# Patient Record
Sex: Female | Born: 1935 | Race: White | Hispanic: No | Smoking: Never smoker
Health system: Southern US, Community
[De-identification: ages and names within clinical notes are randomized; demographics above are authoritative.]

## PROBLEM LIST (undated history)

## (undated) DIAGNOSIS — N200 Calculus of kidney: Secondary | ICD-10-CM

## (undated) DIAGNOSIS — N390 Urinary tract infection, site not specified: Secondary | ICD-10-CM

## (undated) DIAGNOSIS — C801 Malignant (primary) neoplasm, unspecified: Secondary | ICD-10-CM

## (undated) DIAGNOSIS — I639 Cerebral infarction, unspecified: Secondary | ICD-10-CM

## (undated) HISTORY — DX: Cerebral infarction, unspecified: I63.9

## (undated) HISTORY — PX: COLON SURGERY: SHX602

## (undated) HISTORY — PX: ABDOMINAL HYSTERECTOMY: SHX81

---

## 2015-10-12 ENCOUNTER — Encounter (HOSPITAL_BASED_OUTPATIENT_CLINIC_OR_DEPARTMENT_OTHER): Payer: Self-pay | Admitting: *Deleted

## 2015-10-12 ENCOUNTER — Emergency Department (HOSPITAL_BASED_OUTPATIENT_CLINIC_OR_DEPARTMENT_OTHER): Payer: Medicare Other

## 2015-10-12 ENCOUNTER — Inpatient Hospital Stay (HOSPITAL_BASED_OUTPATIENT_CLINIC_OR_DEPARTMENT_OTHER)
Admission: EM | Admit: 2015-10-12 | Discharge: 2015-10-18 | DRG: 065 | Disposition: A | Discharge status: Skilled Nursing Facility | Payer: Medicare Other | Attending: Internal Medicine | Admitting: Internal Medicine

## 2015-10-12 DIAGNOSIS — E669 Obesity, unspecified: Secondary | ICD-10-CM | POA: Diagnosis present

## 2015-10-12 DIAGNOSIS — R410 Disorientation, unspecified: Secondary | ICD-10-CM | POA: Diagnosis present

## 2015-10-12 DIAGNOSIS — Z91013 Allergy to seafood: Secondary | ICD-10-CM

## 2015-10-12 DIAGNOSIS — R778 Other specified abnormalities of plasma proteins: Secondary | ICD-10-CM | POA: Diagnosis present

## 2015-10-12 DIAGNOSIS — F0150 Vascular dementia without behavioral disturbance: Secondary | ICD-10-CM | POA: Diagnosis present

## 2015-10-12 DIAGNOSIS — Z6834 Body mass index (BMI) 34.0-34.9, adult: Secondary | ICD-10-CM

## 2015-10-12 DIAGNOSIS — I639 Cerebral infarction, unspecified: Secondary | ICD-10-CM | POA: Diagnosis present

## 2015-10-12 DIAGNOSIS — E785 Hyperlipidemia, unspecified: Secondary | ICD-10-CM | POA: Diagnosis present

## 2015-10-12 DIAGNOSIS — Z87442 Personal history of urinary calculi: Secondary | ICD-10-CM

## 2015-10-12 DIAGNOSIS — Z8673 Personal history of transient ischemic attack (TIA), and cerebral infarction without residual deficits: Secondary | ICD-10-CM

## 2015-10-12 DIAGNOSIS — I10 Essential (primary) hypertension: Secondary | ICD-10-CM | POA: Diagnosis present

## 2015-10-12 DIAGNOSIS — Z23 Encounter for immunization: Secondary | ICD-10-CM

## 2015-10-12 DIAGNOSIS — I63412 Cerebral infarction due to embolism of left middle cerebral artery: Secondary | ICD-10-CM | POA: Diagnosis not present

## 2015-10-12 DIAGNOSIS — E7800 Pure hypercholesterolemia, unspecified: Secondary | ICD-10-CM

## 2015-10-12 DIAGNOSIS — R4182 Altered mental status, unspecified: Secondary | ICD-10-CM

## 2015-10-12 DIAGNOSIS — B961 Klebsiella pneumoniae [K. pneumoniae] as the cause of diseases classified elsewhere: Secondary | ICD-10-CM | POA: Diagnosis present

## 2015-10-12 DIAGNOSIS — Z87440 Personal history of urinary (tract) infections: Secondary | ICD-10-CM

## 2015-10-12 DIAGNOSIS — R4701 Aphasia: Secondary | ICD-10-CM | POA: Diagnosis present

## 2015-10-12 DIAGNOSIS — Z85038 Personal history of other malignant neoplasm of large intestine: Secondary | ICD-10-CM | POA: Diagnosis present

## 2015-10-12 DIAGNOSIS — R030 Elevated blood-pressure reading, without diagnosis of hypertension: Secondary | ICD-10-CM | POA: Diagnosis present

## 2015-10-12 DIAGNOSIS — N390 Urinary tract infection, site not specified: Secondary | ICD-10-CM

## 2015-10-12 DIAGNOSIS — Z9049 Acquired absence of other specified parts of digestive tract: Secondary | ICD-10-CM

## 2015-10-12 HISTORY — DX: Calculus of kidney: N20.0

## 2015-10-12 HISTORY — DX: Urinary tract infection, site not specified: N39.0

## 2015-10-12 HISTORY — DX: Malignant (primary) neoplasm, unspecified: C80.1

## 2015-10-12 LAB — COMPREHENSIVE METABOLIC PANEL
ALK PHOS: 64 U/L (ref 38–126)
ALT: 9 U/L — AB (ref 14–54)
ANION GAP: 8 (ref 5–15)
AST: 14 U/L — ABNORMAL LOW (ref 15–41)
Albumin: 4.1 g/dL (ref 3.5–5.0)
BILIRUBIN TOTAL: 0.6 mg/dL (ref 0.3–1.2)
BUN: 22 mg/dL — ABNORMAL HIGH (ref 6–20)
CALCIUM: 9.6 mg/dL (ref 8.9–10.3)
CO2: 21 mmol/L — AB (ref 22–32)
CREATININE: 1.23 mg/dL — AB (ref 0.44–1.00)
Chloride: 109 mmol/L (ref 101–111)
GFR, EST AFRICAN AMERICAN: 45 mL/min — AB (ref >60)
GFR, EST NON AFRICAN AMERICAN: 39 mL/min — AB (ref >60)
Glucose, Bld: 121 mg/dL — ABNORMAL HIGH (ref 65–99)
Potassium: 3.9 mmol/L (ref 3.5–5.1)
Sodium: 138 mmol/L (ref 135–145)
TOTAL PROTEIN: 7.1 g/dL (ref 6.5–8.1)

## 2015-10-12 LAB — CBC WITH DIFFERENTIAL/PLATELET
Basophils Absolute: 0 10*3/uL (ref 0.0–0.1)
Basophils Relative: 0 %
EOS ABS: 0 10*3/uL (ref 0.0–0.7)
Eosinophils Relative: 0 %
HEMATOCRIT: 44.8 % (ref 36.0–46.0)
HEMOGLOBIN: 14.7 g/dL (ref 12.0–15.0)
LYMPHS ABS: 2 10*3/uL (ref 0.7–4.0)
LYMPHS PCT: 24 %
MCH: 27.7 pg (ref 26.0–34.0)
MCHC: 32.8 g/dL (ref 30.0–36.0)
MCV: 84.4 fL (ref 78.0–100.0)
MONOS PCT: 8 %
Monocytes Absolute: 0.7 10*3/uL (ref 0.1–1.0)
NEUTROS ABS: 5.7 10*3/uL (ref 1.7–7.7)
NEUTROS PCT: 68 %
Platelets: 215 10*3/uL (ref 150–400)
RBC: 5.31 MIL/uL — AB (ref 3.87–5.11)
RDW: 14.7 % (ref 11.5–15.5)
WBC: 8.5 10*3/uL (ref 4.0–10.5)

## 2015-10-12 LAB — URINE MICROSCOPIC-ADD ON: SQUAMOUS EPITHELIAL / LPF: NONE SEEN

## 2015-10-12 LAB — URINALYSIS, ROUTINE W REFLEX MICROSCOPIC
BILIRUBIN URINE: NEGATIVE
Glucose, UA: NEGATIVE mg/dL
HGB URINE DIPSTICK: NEGATIVE
KETONES UR: NEGATIVE mg/dL
NITRITE: POSITIVE — AB
PH: 6 (ref 5.0–8.0)
Protein, ur: NEGATIVE mg/dL
SPECIFIC GRAVITY, URINE: 1.014 (ref 1.005–1.030)

## 2015-10-12 LAB — TROPONIN I: Troponin I: 0.03 ng/mL (ref <0.03)

## 2015-10-12 LAB — CBG MONITORING, ED: Glucose-Capillary: 119 mg/dL — ABNORMAL HIGH (ref 65–99)

## 2015-10-12 MED ORDER — ASPIRIN EC 81 MG PO TBEC
81.0000 mg | DELAYED_RELEASE_TABLET | Freq: Once | ORAL | Status: AC
  Administered 2015-10-12: 81 mg via ORAL
  Filled 2015-10-12: qty 1

## 2015-10-12 NOTE — Progress Notes (Addendum)
Patient has arrived to unit via Insurance underwriter from [employer]. Patient alert to self only, pleasantly confused. Patient has no complaints at this time and does not appear to be in any distress. Patient placed on cardiac monitor. Call bell in reach. Will monitor

## 2015-10-12 NOTE — ED Notes (Signed)
md and primary rn notified of troponin.

## 2015-10-12 NOTE — ED Triage Notes (Signed)
Confusion. Daughter states she had an episode of mumbling and altered mental status last night but the symptoms resolved. She went to her MD this am and was alert but became confused at 11am while in the office.

## 2015-10-12 NOTE — Progress Notes (Signed)
Patient placement made aware of patient's arrival and will send information out to attending MD.

## 2015-10-12 NOTE — Progress Notes (Signed)
Spoke with MD who will come see patient.

## 2015-10-12 NOTE — ED Provider Notes (Signed)
[facility] Provider Note   CSN: [id number] Arrival date & time: 10/12/15  1649  By signing my name below, I, Arianna Nassar, attest that this documentation has been prepared under the direction and in the presence of Jola Schmidt, MD.  Electronically Signed: Julien Nordmann, ED Scribe. 10/12/15. 5:36 PM.    History   Chief Complaint Chief Complaint  Patient presents with  . Altered Mental Status   LEVEL 5 CAVEAT DUE TO ALTERED MENTAL STATUS   The history is provided by a relative. The history is limited by the condition of the patient. No language interpreter was used.   HPI Comments: Darlene Flores is a 82 y.o. female who has a PMhx of cancer, nephrolithiasis, and UTI presents to the Emergency Department complaining of sudden onset, intermittent, gradual worsening, altered mental status x 6 hours. Pt had an episode of confusion last night around 8 pm that lasted for ~ 15 minutes. Daughter notes she asked the pt as question and she states that pt began to [facility] words. Daughter notes pt went to sleep and woke up this morning with no confusion. They went to pt's colon cancer MD this morning ~ 11 am this morning and was unable to recall her birth date. Pt has hx of frequent urosepsis from UTI and bilateral nephrolithiasis. Pt also has a hx of nephrostomy tubes placed. Daughter notes pt constantly has a low-grade UTI. Pt does not have a hx of CVA, hypercholesterolemia, MI, DM or stents placed. She currently denies any pain. Daughter denies any new cold symptoms, fever, loss of appetite, abdominal pain, or recent injuries/falls.    Past Medical History:  Diagnosis Date  . Cancer ([place])   . Kidney stones   . UTI (urinary tract infection)     There are no active problems to display for this patient.   Past Surgical History:  Procedure Laterality Date  . ABDOMINAL HYSTERECTOMY    . COLON SURGERY      OB History    No data available       Home Medications    Prior  to Admission medications   Medication Sig Start Date End Date Taking? Authorizing Provider  psyllium (METAMUCIL) 58.6 % packet Take 1 packet by mouth daily.   Yes Historical Provider, MD    Family History No family history on file.  Social History Social History  Substance Use Topics  . Smoking status: Never Smoker  . Smokeless tobacco: Never Used  . Alcohol use No     Allergies   Iodine and Shellfish allergy   Review of Systems Review of Systems  LEVEL 5 CAVEAT DUE TO ALTERED MENTAL STATUS  Physical Exam Updated Vital Signs BP 154/87   Pulse 95   Temp 98 F (36.7 C) (Oral)   Resp 18   SpO2 95%   Physical Exam  Constitutional: She appears well-developed and well-nourished. No distress.  HENT:  Head: Normocephalic and atraumatic.  Eyes: EOM are normal. Pupils are equal, round, and reactive to light.  Neck: Normal range of motion.  Cardiovascular: Normal rate, regular rhythm and normal heart sounds.   Pulmonary/Chest: Effort normal and breath sounds normal.  Abdominal: Soft. She exhibits no distension. There is no tenderness.  Musculoskeletal: Normal range of motion.  Neurological: She is alert.  5/5 strength in major muscle groups of  bilateral upper and lower extremities. Speech normal. No facial asymetry.  Difficulty naming simple objects. Oriented to self and month but not year.  Skin: Skin is  warm and dry.  Psychiatric: She has a normal mood and affect. Judgment normal.  Nursing note and vitals reviewed.    ED Treatments / Results  DIAGNOSTIC STUDIES: Oxygen Saturation is 95% on RA, adequate by my interpretation.  COORDINATION OF CARE:  5:31 PM Will order CT Head wo contrast. Discussed treatment plan with pt at bedside and pt agreed to plan.  Labs (all labs ordered are listed, but only abnormal results are displayed) Labs Reviewed  CBC WITH DIFFERENTIAL/PLATELET - Abnormal; Notable for the following:       Result Value   RBC 5.31 (*)    All other  components within normal limits  COMPREHENSIVE METABOLIC PANEL - Abnormal; Notable for the following:    CO2 21 (*)    Glucose, Bld 121 (*)    BUN 22 (*)    Creatinine, Ser 1.23 (*)    AST 14 (*)    ALT 9 (*)    GFR calc non Af Amer 39 (*)    GFR calc Af Amer 45 (*)    All other components within normal limits  TROPONIN I - Abnormal; Notable for the following:    Troponin I 0.03 (*)    All other components within normal limits  URINALYSIS, ROUTINE W REFLEX MICROSCOPIC (NOT AT [facility]) - Abnormal; Notable for the following:    APPearance CLOUDY (*)    Nitrite POSITIVE (*)    Leukocytes, UA LARGE (*)    All other components within normal limits  URINE MICROSCOPIC-ADD ON - Abnormal; Notable for the following:    Bacteria, UA MANY (*)    All other components within normal limits  CBG MONITORING, ED - Abnormal; Notable for the following:    Glucose-Capillary 119 (*)    All other components within normal limits  URINE CULTURE    EKG  EKG Interpretation  Date/Time:  Thursday October 12 2015 17:08:40 EDT Ventricular Rate:  90 PR Interval:    QRS Duration: 98 QT Interval:  353 QTC Calculation: 432 R Axis:   -23 Text Interpretation:  Sinus rhythm Left ventricular hypertrophy Inferior infarct, old No old tracing to compare Confirmed by [place] Decoteau  MD, Lennette Bihari ([id number]) on 10/12/2015 5:30:33 PM       Radiology Dg Chest 2 View  Result Date: 10/12/2015 CLINICAL DATA:  Altered mental status with confusion for the past 6 hours. EXAM: CHEST  2 VIEW COMPARISON:  None. FINDINGS: Enlarged cardiac silhouette with a prominent left ventricular contour. Tortuous and calcified thoracic aorta. Clear lungs with mildly prominent interstitial markings. No pleural fluid. Thoracic spine degenerative changes. Bilateral shoulder degenerative changes with evidence of chronic bilateral rotator cuff tears. Diffuse osteopenia. IMPRESSION: 1. No acute abnormality. 2. Cardiomegaly with probable left ventricular  hypertrophy. 3. Aortic atherosclerosis. Electronically Signed   By: Claudie Revering M.D.   On: 10/12/2015 18:07   Ct Head Wo Contrast  Result Date: 10/12/2015 CLINICAL DATA:  Altered mental status for the past 6 hours, with confusion. EXAM: CT HEAD WITHOUT CONTRAST TECHNIQUE: Contiguous axial images were obtained from the base of the skull through the vertex without intravenous contrast. COMPARISON:  None. FINDINGS: Brain: Diffusely enlarged ventricles and subarachnoid spaces. Patchy white matter low density in both cerebral hemispheres. Old right frontal lobe infarct additional old right frontal lobe lacunar infarct. Left occipital arachnoid cyst at the small communication with the occipital horn of the left lateral ventricle. No intracranial hemorrhage, mass lesion or CT evidence of acute infarction. Vascular: No hyperdense vessel or unexpected calcification.  Skull: Normal. Negative for fracture or focal lesion. Sinuses/Orbits: Right mid ethmoid sinus mucosal thickening. Unremarkable orbits. Other: None. IMPRESSION: 1. No acute abnormality. 2. Mild to moderate diffuse cerebral atrophy and mild cerebellar atrophy. 3. 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 4. Mild chronic small vessel white matter ischemic changes in both cerebral hemispheres. Electronically Signed   By: Claudie Revering M.D.   On: 10/12/2015 18:06    Procedures Procedures (including critical care time)  Medications Ordered in ED Medications  aspirin EC tablet 81 mg (not administered)     Initial Impression / Assessment and Plan / ED Course  I have reviewed the triage vital signs and the nursing notes.  Pertinent labs & imaging results that were available during my care of the patient were reviewed by me and considered in my medical decision making (see chart for details).  Clinical Course   Patient with acute confusion. Nothing focal on exam except for what appears to be a receptive aphasia. Patient has difficulty naming simple objects.  She has difficulty following the conversation and comprehending the care that is being provided. This is very abnormal for the patient. She also has a history of recurrent nitrite positive urine which currently are infectious disease and urology team awake for*not treating unless she has urologic symptoms. Currently she is without suprapubic pain, fever, elevated white blood cell count. I therefore sent her urine for culture and we'll elect not to treat based on family preference at this time. She has no significant medical problems but her CT head does show evidence of prior stroke. I suspect this is recurrent stroke acutely. Nothing by mouth until swallow screen. Aspirin once her swallow screen is passed. Patient will be placed in observational status at [facility]  I personally performed the services described in this documentation, which was scribed in my presence. The recorded information has been reviewed and is accurate.     Final Clinical Impressions(s) / ED Diagnoses   Final diagnoses:  Altered mental status, unspecified altered mental status type    New Prescriptions New Prescriptions   No medications on file     Jola Schmidt, MD 10/12/15 [phone]

## 2015-10-13 ENCOUNTER — Encounter (HOSPITAL_COMMUNITY): Payer: Self-pay | Admitting: Internal Medicine

## 2015-10-13 ENCOUNTER — Observation Stay (HOSPITAL_COMMUNITY): Payer: Medicare Other

## 2015-10-13 DIAGNOSIS — Z91013 Allergy to seafood: Secondary | ICD-10-CM | POA: Diagnosis not present

## 2015-10-13 DIAGNOSIS — B961 Klebsiella pneumoniae [K. pneumoniae] as the cause of diseases classified elsewhere: Secondary | ICD-10-CM | POA: Diagnosis present

## 2015-10-13 DIAGNOSIS — I6789 Other cerebrovascular disease: Secondary | ICD-10-CM | POA: Diagnosis not present

## 2015-10-13 DIAGNOSIS — R4182 Altered mental status, unspecified: Secondary | ICD-10-CM | POA: Diagnosis present

## 2015-10-13 DIAGNOSIS — I639 Cerebral infarction, unspecified: Secondary | ICD-10-CM | POA: Diagnosis not present

## 2015-10-13 DIAGNOSIS — Z85038 Personal history of other malignant neoplasm of large intestine: Secondary | ICD-10-CM | POA: Diagnosis present

## 2015-10-13 DIAGNOSIS — R778 Other specified abnormalities of plasma proteins: Secondary | ICD-10-CM | POA: Diagnosis present

## 2015-10-13 DIAGNOSIS — Z23 Encounter for immunization: Secondary | ICD-10-CM | POA: Diagnosis not present

## 2015-10-13 DIAGNOSIS — N390 Urinary tract infection, site not specified: Secondary | ICD-10-CM | POA: Diagnosis present

## 2015-10-13 DIAGNOSIS — I63412 Cerebral infarction due to embolism of left middle cerebral artery: Principal | ICD-10-CM

## 2015-10-13 DIAGNOSIS — Z87442 Personal history of urinary calculi: Secondary | ICD-10-CM | POA: Diagnosis not present

## 2015-10-13 DIAGNOSIS — R4701 Aphasia: Secondary | ICD-10-CM | POA: Diagnosis present

## 2015-10-13 DIAGNOSIS — I63419 Cerebral infarction due to embolism of unspecified middle cerebral artery: Secondary | ICD-10-CM | POA: Diagnosis not present

## 2015-10-13 DIAGNOSIS — I6340 Cerebral infarction due to embolism of unspecified cerebral artery: Secondary | ICD-10-CM | POA: Diagnosis not present

## 2015-10-13 DIAGNOSIS — Z6834 Body mass index (BMI) 34.0-34.9, adult: Secondary | ICD-10-CM | POA: Diagnosis not present

## 2015-10-13 DIAGNOSIS — F0150 Vascular dementia without behavioral disturbance: Secondary | ICD-10-CM | POA: Diagnosis present

## 2015-10-13 DIAGNOSIS — Z8673 Personal history of transient ischemic attack (TIA), and cerebral infarction without residual deficits: Secondary | ICD-10-CM | POA: Diagnosis not present

## 2015-10-13 DIAGNOSIS — R030 Elevated blood-pressure reading, without diagnosis of hypertension: Secondary | ICD-10-CM

## 2015-10-13 DIAGNOSIS — I10 Essential (primary) hypertension: Secondary | ICD-10-CM | POA: Diagnosis present

## 2015-10-13 DIAGNOSIS — Z87440 Personal history of urinary (tract) infections: Secondary | ICD-10-CM | POA: Diagnosis not present

## 2015-10-13 DIAGNOSIS — E669 Obesity, unspecified: Secondary | ICD-10-CM | POA: Diagnosis present

## 2015-10-13 DIAGNOSIS — Z9049 Acquired absence of other specified parts of digestive tract: Secondary | ICD-10-CM | POA: Diagnosis not present

## 2015-10-13 DIAGNOSIS — E785 Hyperlipidemia, unspecified: Secondary | ICD-10-CM | POA: Diagnosis present

## 2015-10-13 LAB — CBC
HEMATOCRIT: 43.9 % (ref 36.0–46.0)
HEMOGLOBIN: 13.7 g/dL (ref 12.0–15.0)
MCH: 27.4 pg (ref 26.0–34.0)
MCHC: 31.2 g/dL (ref 30.0–36.0)
MCV: 87.8 fL (ref 78.0–100.0)
Platelets: 190 10*3/uL (ref 150–400)
RBC: 5 MIL/uL (ref 3.87–5.11)
RDW: 14.4 % (ref 11.5–15.5)
WBC: 8.3 10*3/uL (ref 4.0–10.5)

## 2015-10-13 LAB — COMPREHENSIVE METABOLIC PANEL
ALBUMIN: 3.3 g/dL — AB (ref 3.5–5.0)
ALT: 7 U/L — ABNORMAL LOW (ref 14–54)
AST: 14 U/L — AB (ref 15–41)
Alkaline Phosphatase: 57 U/L (ref 38–126)
Anion gap: 9 (ref 5–15)
BILIRUBIN TOTAL: 0.4 mg/dL (ref 0.3–1.2)
BUN: 18 mg/dL (ref 6–20)
CO2: 20 mmol/L — AB (ref 22–32)
Calcium: 9.2 mg/dL (ref 8.9–10.3)
Chloride: 111 mmol/L (ref 101–111)
Creatinine, Ser: 1.25 mg/dL — ABNORMAL HIGH (ref 0.44–1.00)
GFR calc Af Amer: 44 mL/min — ABNORMAL LOW (ref >60)
GFR calc non Af Amer: 38 mL/min — ABNORMAL LOW (ref >60)
GLUCOSE: 184 mg/dL — AB (ref 65–99)
POTASSIUM: 3.4 mmol/L — AB (ref 3.5–5.1)
Sodium: 140 mmol/L (ref 135–145)
TOTAL PROTEIN: 5.9 g/dL — AB (ref 6.5–8.1)

## 2015-10-13 LAB — TROPONIN I
Troponin I: <0.03 ng/mL (ref <0.03)
Troponin I: <0.03 ng/mL (ref <0.03)
Troponin I: 0.03 ng/mL (ref <0.03)

## 2015-10-13 MED ORDER — PSYLLIUM 95 % PO PACK
1.0000 | PACK | Freq: Every day | ORAL | Status: DC
  Administered 2015-10-13: 1 via ORAL
  Filled 2015-10-13: qty 1

## 2015-10-13 MED ORDER — DEXTROSE 5 % IV SOLN
1.0000 g | INTRAVENOUS | Status: DC
  Administered 2015-10-13: 1 g via INTRAVENOUS
  Filled 2015-10-13: qty 10

## 2015-10-13 MED ORDER — ATORVASTATIN CALCIUM 40 MG PO TABS
40.0000 mg | ORAL_TABLET | Freq: Every day | ORAL | Status: DC
Start: 2015-10-13 — End: 2015-10-18
  Administered 2015-10-13 – 2015-10-17 (×5): 40 mg via ORAL
  Filled 2015-10-13 (×5): qty 1

## 2015-10-13 MED ORDER — ASPIRIN 325 MG PO TABS
325.0000 mg | ORAL_TABLET | Freq: Every day | ORAL | Status: DC
  Administered 2015-10-13 – 2015-10-18 (×6): 325 mg via ORAL
  Filled 2015-10-13 (×6): qty 1

## 2015-10-13 MED ORDER — POTASSIUM CHLORIDE 10 MEQ/100ML IV SOLN
10.0000 meq | INTRAVENOUS | Status: DC
Start: 2015-10-13 — End: 2015-10-13
  Filled 2015-10-13: qty 100

## 2015-10-13 MED ORDER — SODIUM CHLORIDE 0.9 % IV SOLN
INTRAVENOUS | Status: DC
  Administered 2015-10-13 – 2015-10-14 (×3): via INTRAVENOUS

## 2015-10-13 MED ORDER — ENOXAPARIN SODIUM 40 MG/0.4ML ~~LOC~~ SOLN
40.0000 mg | SUBCUTANEOUS | Status: DC
  Administered 2015-10-13 – 2015-10-17 (×5): 40 mg via SUBCUTANEOUS
  Filled 2015-10-13 (×6): qty 0.4

## 2015-10-13 MED ORDER — POTASSIUM CHLORIDE CRYS ER 20 MEQ PO TBCR
40.0000 meq | EXTENDED_RELEASE_TABLET | Freq: Once | ORAL | Status: AC
  Administered 2015-10-13: 40 meq via ORAL
  Filled 2015-10-13: qty 2

## 2015-10-13 MED ORDER — PSYLLIUM 95 % PO PACK
1.0000 | PACK | Freq: Two times a day (BID) | ORAL | Status: DC
  Administered 2015-10-13 – 2015-10-17 (×6): 1 via ORAL
  Filled 2015-10-13 (×7): qty 1

## 2015-10-13 MED ORDER — ASPIRIN 300 MG RE SUPP
300.0000 mg | Freq: Every day | RECTAL | Status: DC
  Filled 2015-10-13 (×6): qty 1

## 2015-10-13 MED ORDER — ASPIRIN EC 81 MG PO TBEC: 81.0000 mg | DELAYED_RELEASE_TABLET | Freq: Every day | ORAL | Status: DC

## 2015-10-13 NOTE — Evaluation (Signed)
Speech Language Pathology Evaluation Patient Details Name: Darlene Flores MRN: [id number] DOB: 08-05-1927 Today's Date: 10/13/2015 Time: [id number] SLP Time Calculation (min) (ACUTE ONLY): 32 min  Problem List:  Patient Active Problem List   Diagnosis Date Noted  . Receptive aphasia 10/13/2015  . Elevated blood pressure reading 10/13/2015  . History of colon cancer 10/13/2015  . Aphasia 10/13/2015  . Acute confusion 10/12/2015   Past Medical History:  Past Medical History:  Diagnosis Date  . Cancer ([place])   . Kidney stones   . UTI (urinary tract infection)    Past Surgical History:  Past Surgical History:  Procedure Laterality Date  . ABDOMINAL HYSTERECTOMY    . COLON SURGERY     HPI:  Darlene Flores is a 82 y.o. female with history of colon cancer status post hemicolectomy being followed at [facility], history of recurrent UTI, kidney stones was brought to the ER after patient was found to have difficulty talking coherently. Patient's daughter stated that patient's symptoms started on the night of October 4th 8 PM while driving in the car with the patient. At that time the spell lasted for 15 minutes and got resolved. Yesterday when patient was taken to patient's routine appointment with general surgeon patient was unable to recall her birthday and was not able to bring out clear words. This persisted through the evening and patient was brought to the ER. CT head unremarkable. MRI brain was obtained showing scattered embolic strokes in the left MCA territory. Neurology was consult to see patient. Currently patient is alert and shows expressive and receptive aphasia.   Assessment / Plan / Recommendation Clinical Impression  Pt referred for cognitive-linguistic assessment following L MCA stroke. Pt presents with global aphasia with poor awareness of deficits. No yes/no reliability despite max A. Pt would benefit from ongoing SLP therapy to maximize functional communication. Pt's  family verbalized understanding. Family also expressed concern as the daughter that stays with pt 24/7 has surgery planned for a brain shunt on 11/1. Consideration for SNF given limited ability for 24/7 in the upcoming weeks.    SLP Assessment  Patient needs continued Speech Lanaguage Pathology Services    Follow Up Recommendations  Skilled Nursing facility (ongoing SLP needed- recs pending other therapy disciplines)    Frequency and Duration min 2x/week  2 weeks      SLP Evaluation Cognition  Overall Cognitive Status: Difficult to assess Arousal/Alertness: Awake/alert Orientation Level:  (unable to establish secondary to global aphasia) Attention: Sustained Sustained Attention: Impaired Sustained Attention Impairment: Verbal basic;Functional basic Awareness: Impaired Awareness Impairment: Intellectual impairment Behaviors: Perseveration Safety/Judgment: Impaired       Comprehension  Auditory Comprehension Overall Auditory Comprehension: Impaired Yes/No Questions: Impaired Basic Biographical Questions: 0-25% accurate Commands: Impaired One Step Basic Commands: 0-24% accurate Conversation: Simple EffectiveTechniques: Visual/Gestural cues (modeling) Reading Comprehension Reading Status: Impaired Word level: Impaired Sentence Level: Impaired Interfering Components:  (aphasia)    Expression Expression Primary Mode of Expression: Verbal Verbal Expression Overall Verbal Expression: Impaired Initiation: Impaired Automatic Speech: Counting (0/5 acc) Level of Generative/Spontaneous Verbalization: Phrase;Sentence Repetition: Impaired Level of Impairment: Word level Naming: Impairment Responsive: 0-25% accurate Confrontation: Impaired Convergent: Not tested Divergent: Not tested Verbal Errors: Perseveration;Neologisms;Semantic paraphasias;Jargon;Not aware of errors Pragmatics: No impairment Non-Verbal Means of Communication:  (unable to ID items in field of 2) Written  Expression Written Expression: Not tested   Oral / Motor  Oral Motor/Sensory Function Overall Oral Motor/Sensory Function: Within functional limits Motor Speech Overall Motor Speech: Appears within functional limits  for tasks assessed Respiration: Within functional limits Phonation: Normal Resonance: Within functional limits Articulation: Within functional limitis Intelligibility: Intelligible   GO          Functional Assessment Tool Used: clinician judgement Functional Limitations: Spoken language comprehension Spoken Language Comprehension Current Status [id number]): 100 percent impaired, limited or restricted Spoken Language Comprehension Goal Status [id number]): At least 1 percent but less than 20 percent impaired, limited or restricted         Vinetta Bergamo MA, [place]  Pager [phone] 10/13/2015, 3:38 PM

## 2015-10-13 NOTE — H&P (Signed)
History and Physical    Bernardina Shabbir [id number] DOB: [birth year] DOA: 10/12/2015  PCP: Red Christians, MD  Patient coming from: [facility].  Chief Complaint: Difficulty speaking and confusion.  History obtained from patient's daughter and patient. I have reviewed patient's old charts through [facility].  HPI: Darlene Flores is a 82 y.o. female with history of colon cancer status post hemicolectomy being followed at [facility], history of recurrent UTI, kidney stones was brought to the ER after patient was found to have difficulty talking coherently. Patient's daughter stated that patient's symptoms started on the night of October 4th 8 PM while driving in the car with the patient. At that time the spell lasted for 15 minutes and got resolved. Yesterday when patient was taken to patient's routine appointment with general surgeon patient was unable to recall her birthday and was not able to bring out clear words. This persisted through the evening and patient was brought to the ER. CT head unremarkable. Patient's UA does show features consistent with UTI but patient asymptomatic. According to patient's urologist patient was advised not to be treated for asymptomatic pyuria. Patient is being admitted for further management of receptive aphasia. Patient denies any weakness of the extremities difficulty swallowing or any blurred vision or headache.  ED Course: CT head was unremarkable. Troponin is mildly elevated though patient denies chest pain. EKG shows normal sinus rhythm with LVH.  Review of Systems: As per HPI, rest all negative.   Past Medical History:  Diagnosis Date  . Cancer ([place])   . Kidney stones   . UTI (urinary tract infection)     Past Surgical History:  Procedure Laterality Date  . ABDOMINAL HYSTERECTOMY    . COLON SURGERY       reports that she has never smoked. She has never used smokeless tobacco. She reports that she does not drink alcohol or use  drugs.  Allergies  Allergen Reactions  . Iodine     Blisters on her skin  . Shellfish Allergy     vomiting    Family History  Problem Relation Age of Onset  . Lymphoma Mother   . CAD Father   . Melanoma Daughter     Prior to Admission medications   Medication Sig Start Date End Date Taking? Authorizing Provider  psyllium (METAMUCIL) 58.6 % packet Take 1 packet by mouth daily.   Yes Historical Provider, MD    Physical Exam: Vitals:   10/12/15 2030 10/12/15 2100 10/12/15 2202 10/13/15 0020  BP: (!) 142/123 135/79 (!) 159/82 (!) 167/69  Pulse: 78 75 79 80  Resp: 19 15 16 16   Temp:   97.8 F (36.6 C) 97.4 F (36.3 C)  TempSrc:   Oral Oral  SpO2: 93% 99% 98% 95%  Weight:   90.9 kg (200 lb 4.8 oz)   Height:   5\' 4"  (1.626 m)       Constitutional: Moderately built and nourished. Vitals:   10/12/15 2030 10/12/15 2100 10/12/15 2202 10/13/15 0020  BP: (!) 142/123 135/79 (!) 159/82 (!) 167/69  Pulse: 78 75 79 80  Resp: 19 15 16 16   Temp:   97.8 F (36.6 C) 97.4 F (36.3 C)  TempSrc:   Oral Oral  SpO2: 93% 99% 98% 95%  Weight:   90.9 kg (200 lb 4.8 oz)   Height:   5\' 4"  (1.626 m)    Eyes: Anicteric no pallor. ENMT: No discharge from the ears eyes nose or mouth. Neck:  No mass felt. No neck rigidity. No JVD appreciated. Respiratory: No rhonchi or crepitations. Cardiovascular: S1-S2 heard. No murmur appreciated. Abdomen: Soft nontender bowel sounds present. No guarding or rigidity. Musculoskeletal: No edema. No joint effusion. Skin: No rash. Skin appears warm. Neurologic: Moves all extremities 5 x 5. No facial asymmetry. PERRLA positive. Tongue is midline. Psychiatric: Alert awake oriented to time place and person. Appears normal.   Labs on Admission: I have personally reviewed following labs and imaging studies  CBC:  Recent Labs Lab 10/12/15 1715  WBC 8.5  NEUTROABS 5.7  HGB 14.7  HCT 44.8  MCV 84.4  PLT [id number]   Basic Metabolic Panel:  Recent  Labs Lab 10/12/15 1715  NA 138  K 3.9  CL 109  CO2 21*  GLUCOSE 121*  BUN 22*  CREATININE 1.23*  CALCIUM 9.6   GFR: Estimated Creatinine Clearance: 35.9 mL/min (by C-G formula based on SCr of 1.23 mg/dL (H)). Liver Function Tests:  Recent Labs Lab 10/12/15 1715  AST 14*  ALT 9*  ALKPHOS 64  BILITOT 0.6  PROT 7.1  ALBUMIN 4.1   No results for input(s): LIPASE, AMYLASE in the last 168 hours. No results for input(s): AMMONIA in the last 168 hours. Coagulation Profile: No results for input(s): INR, PROTIME in the last 168 hours. Cardiac Enzymes:  Recent Labs Lab 10/12/15 1715  TROPONINI 0.03*   BNP (last 3 results) No results for input(s): PROBNP in the last 8760 hours. HbA1C: No results for input(s): HGBA1C in the last 72 hours. CBG:  Recent Labs Lab 10/12/15 1705  GLUCAP 119*   Lipid Profile: No results for input(s): CHOL, HDL, LDLCALC, TRIG, CHOLHDL, LDLDIRECT in the last 72 hours. Thyroid Function Tests: No results for input(s): TSH, T4TOTAL, FREET4, T3FREE, THYROIDAB in the last 72 hours. Anemia Panel: No results for input(s): VITAMINB12, FOLATE, FERRITIN, TIBC, IRON, RETICCTPCT in the last 72 hours. Urine analysis:    Component Value Date/Time   COLORURINE YELLOW 10/12/2015 1808   APPEARANCEUR CLOUDY (A) 10/12/2015 1808   LABSPEC 1.014 10/12/2015 1808   PHURINE 6.0 10/12/2015 1808   GLUCOSEU NEGATIVE 10/12/2015 1808   HGBUR NEGATIVE 10/12/2015 1808   BILIRUBINUR NEGATIVE 10/12/2015 1808   KETONESUR NEGATIVE 10/12/2015 1808   PROTEINUR NEGATIVE 10/12/2015 1808   NITRITE POSITIVE (A) 10/12/2015 1808   LEUKOCYTESUR LARGE (A) 10/12/2015 1808   Sepsis Labs: @LABRCNTIP (procalcitonin:4,lacticidven:4) )No results found for this or any previous visit (from the past 240 hour(s)).   Radiological Exams on Admission: Dg Chest 2 View  Result Date: 10/12/2015 CLINICAL DATA:  Altered mental status with confusion for the past 6 hours. EXAM: CHEST  2 VIEW  COMPARISON:  None. FINDINGS: Enlarged cardiac silhouette with a prominent left ventricular contour. Tortuous and calcified thoracic aorta. Clear lungs with mildly prominent interstitial markings. No pleural fluid. Thoracic spine degenerative changes. Bilateral shoulder degenerative changes with evidence of chronic bilateral rotator cuff tears. Diffuse osteopenia. IMPRESSION: 1. No acute abnormality. 2. Cardiomegaly with probable left ventricular hypertrophy. 3. Aortic atherosclerosis. Electronically Signed   By: Claudie Revering M.D.   On: 10/12/2015 18:07   Ct Head Wo Contrast  Result Date: 10/12/2015 CLINICAL DATA:  Altered mental status for the past 6 hours, with confusion. EXAM: CT HEAD WITHOUT CONTRAST TECHNIQUE: Contiguous axial images were obtained from the base of the skull through the vertex without intravenous contrast. COMPARISON:  None. FINDINGS: Brain: Diffusely enlarged ventricles and subarachnoid spaces. Patchy white matter low density in both cerebral hemispheres. Old right frontal lobe  infarct additional old right frontal lobe lacunar infarct. Left occipital arachnoid cyst at the small communication with the occipital horn of the left lateral ventricle. No intracranial hemorrhage, mass lesion or CT evidence of acute infarction. Vascular: No hyperdense vessel or unexpected calcification. Skull: Normal. Negative for fracture or focal lesion. Sinuses/Orbits: Right mid ethmoid sinus mucosal thickening. Unremarkable orbits. Other: None. IMPRESSION: 1. No acute abnormality. 2. Mild to moderate diffuse cerebral atrophy and mild cerebellar atrophy. 3. 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 4. Mild chronic small vessel white matter ischemic changes in both cerebral hemispheres. Electronically Signed   By: Claudie Revering M.D.   On: 10/12/2015 18:06    EKG: Independently reviewed. Normal sinus rhythm with LVH.  Assessment/Plan Principal Problem:   Receptive aphasia Active Problems:   Acute confusion    Elevated blood pressure reading   History of colon cancer   Aphasia    1. Receptive aphasia - patient has passed swallow evaluation. Check MRI brain for stroke. If positive then further stroke workup. 2. Elevated troponin - denies any chest pain. Check 2-D echo and cycle cardiac markers. On aspirin. 3. Elevated blood pressure - since patient has possible stroke at this time we will allow for permissive hypertension until stroke ruled out. 4. History of recurrent UTI - UA-shows features concerning for UTI but patient asymptomatic afebrile and denies any dysuria. Follow urine cultures. 5. History of colon cancer status post hemicolectomy being [facility] at [facility].   DVT prophylaxis: Lovenox. Code Status: Full code.  Family Communication: Patient's daughter.  Disposition Plan: Home.  Consults called: None.  Admission status: Observation.    Rise Patience MD [facility] Pager [phone].  If 7PM-7AM, please contact night-coverage [email or web address] Password [email or web address]  10/13/2015, 12:47 AM

## 2015-10-13 NOTE — Consult Note (Signed)
Requesting Physician: [facility]    Chief Complaint: Stroke    HPI:                                                                                                                                         Darlene Flores is a 82 y.o. female with history of colon cancer status post hemicolectomy being followed at [facility], history of recurrent UTI, kidney stones was brought to the ER after patient was found to have difficulty talking coherently. Patient's daughter stated that patient's symptoms started on the night of October 4th 8 PM while driving in the car with the patient. At that time the spell lasted for 15 minutes and got resolved. Yesterday when patient was taken to patient's routine appointment with general surgeon patient was unable to recall her birthday and was not able to bring out clear words. This persisted through the evening and patient was brought to the ER. CT head unremarkable. MRI brain was obtained showing scattered embolic strokes in the left MCA territory. Neurology was consult to see patient. Currently patient is alert and shows expressive and receptive aphasia.  Date last known well: Date: 10/11/2015 Time last known well: Time: 16:00 tPA Given: No: Out of window   Past Medical History:  Diagnosis Date  . Cancer ([place])   . Kidney stones   . UTI (urinary tract infection)     Past Surgical History:  Procedure Laterality Date  . ABDOMINAL HYSTERECTOMY    . COLON SURGERY      Family History  Problem Relation Age of Onset  . Lymphoma Mother   . CAD Father   . Melanoma Daughter    Social History:  reports that she has never smoked. She has never used smokeless tobacco. She reports that she does not drink alcohol or use drugs.  Allergies:  Allergies  Allergen Reactions  . Ivp Dye [Iodinated Diagnostic Agents] Other (See Comments)    Was told not take/use due to Shellfish allergy  . Shellfish Allergy Other (See Comments)    vomiting  . Iodine  Other (See Comments)    Blisters on her skin    Medications:                                                                                                                           Prior to Admission:  Prescriptions Prior to Admission  Medication Sig Dispense Refill Last Dose  . psyllium (METAMUCIL) 58.6 % packet Take 1 packet by mouth at bedtime.    10/12/2015 at Unknown time   Scheduled: . aspirin  300 mg Rectal Daily   Or  . aspirin  325 mg Oral Daily  . atorvastatin  40 mg Oral q1800  . enoxaparin (LOVENOX) injection  40 mg Subcutaneous Q24H  . psyllium  1 packet Oral BID    ROS:                                                                                                                                       History obtained from the patient  General ROS: negative for - chills, fatigue, fever, night sweats, weight gain or weight loss Psychological ROS: negative for - behavioral disorder, hallucinations, memory difficulties, mood swings or suicidal ideation Ophthalmic ROS: negative for - blurry vision, double vision, eye pain or loss of vision ENT ROS: negative for - epistaxis, nasal discharge, oral lesions, sore throat, tinnitus or vertigo Allergy and Immunology ROS: negative for - hives or itchy/watery eyes Hematological and Lymphatic ROS: negative for - bleeding problems, bruising or swollen lymph nodes Endocrine ROS: negative for - galactorrhea, hair pattern changes, polydipsia/polyuria or temperature intolerance Respiratory ROS: negative for - cough, hemoptysis, shortness of breath or wheezing Cardiovascular ROS: negative for - chest pain, dyspnea on exertion, edema or irregular heartbeat Gastrointestinal ROS: negative for - abdominal pain, diarrhea, hematemesis, nausea/vomiting or stool incontinence Genito-Urinary ROS: negative for - dysuria, hematuria, incontinence or urinary frequency/urgency Musculoskeletal ROS: negative for - joint swelling or muscular  weakness Neurological ROS: as noted in HPI Dermatological ROS: negative for rash and skin lesion changes  Neurologic Examination:                                                                                                      Blood pressure (!) 145/82, pulse 73, temperature 97.6 F (36.4 C), temperature source Oral, resp. rate 18, height 5\' 4"  (1.626 m), weight 91.4 kg (201 lb 8 oz), SpO2 96 %.  HEENT-  Normocephalic, no lesions, without obvious abnormality.  Normal external eye and conjunctiva.  Normal TM's bilaterally.  Normal auditory canals and external ears. Normal external nose, mucus membranes and septum.  Normal pharynx. Cardiovascular- S1, S2 normal, pulses palpable throughout   Lungs- chest clear, no wheezing, rales, normal symmetric air entry Abdomen- normal findings: bowel sounds normal Extremities- no edema Lymph-no adenopathy palpable  Musculoskeletal-no joint tenderness, deformity or swelling Skin-warm and dry, no hyperpigmentation, vitiligo, or suspicious lesions  Neurological Examination Mental Status: Alert, patient shows expressive and receptive aphasia. Initially her expressive aphasia was worse than her receptive aphasia and she was unable to tell us where she was. Later during consultation it was noted that she had preserved expressive capabilities but was having significant problems following commands. She was able to follow commands by mimicking my motions. Cranial Nerves: II: Discs flat bilaterally; Visual fields grossly normal, pupils equal, round, reactive to light and accommodation III,IV, VI: ptosis not present, extra-ocular motions intact bilaterally V,VII: smile symmetric, facial light touch sensation normal bilaterally VIII: hearing normal bilaterally IX,X: uvula rises symmetrically XI: bilateral shoulder shrug XII: midline tongue extension Motor: Right : Upper extremity   5/5    Left:     Upper extremity   5/5  Lower extremity   5/5     Lower  extremity   5/5 Tone and bulk:normal tone throughout; no atrophy noted Sensory: Pinprick and light touch intact throughout, bilaterally Deep Tendon Reflexes: 2+ and symmetric throughout Plantars: Right: downgoing   Left: downgoing Cerebellar: normal finger-to-nose,  and normal heel-to-shin test Gait: Not tested       Lab Results: Basic Metabolic Panel:  Recent Labs Lab 10/12/15 1715 10/13/15 0102  NA 138 140  K 3.9 3.4*  CL 109 111  CO2 21* 20*  GLUCOSE 121* 184*  BUN 22* 18  CREATININE 1.23* 1.25*  CALCIUM 9.6 9.2    Liver Function Tests:  Recent Labs Lab 10/12/15 1715 10/13/15 0102  AST 14* 14*  ALT 9* 7*  ALKPHOS 64 57  BILITOT 0.6 0.4  PROT 7.1 5.9*  ALBUMIN 4.1 3.3*   No results for input(s): LIPASE, AMYLASE in the last 168 hours. No results for input(s): AMMONIA in the last 168 hours.  CBC:  Recent Labs Lab 10/12/15 1715 10/13/15 0102  WBC 8.5 8.3  NEUTROABS 5.7  --   HGB 14.7 13.7  HCT 44.8 43.9  MCV 84.4 87.8  PLT 215 190    Cardiac Enzymes:  Recent Labs Lab 10/12/15 1715 10/13/15 1024 10/13/15 1201  TROPONINI 0.03* <0.03 <0.03    Lipid Panel: No results for input(s): CHOL, TRIG, HDL, CHOLHDL, VLDL, LDLCALC in the last 168 hours.  CBG:  Recent Labs Lab 10/12/15 1705  GLUCAP 119*    Microbiology: No results found for this or any previous visit.  Coagulation Studies: No results for input(s): LABPROT, INR in the last 72 hours.  Imaging: Dg Chest 2 View  Result Date: 10/12/2015 CLINICAL DATA:  Altered mental status with confusion for the past 6 hours. EXAM: CHEST  2 VIEW COMPARISON:  None. FINDINGS: Enlarged cardiac silhouette with a prominent left ventricular contour. Tortuous and calcified thoracic aorta. Clear lungs with mildly prominent interstitial markings. No pleural fluid. Thoracic spine degenerative changes. Bilateral shoulder degenerative changes with evidence of chronic bilateral rotator cuff tears. Diffuse  osteopenia. IMPRESSION: 1. No acute abnormality. 2. Cardiomegaly with probable left ventricular hypertrophy. 3. Aortic atherosclerosis. Electronically Signed   By: Claudie Revering M.D.   On: 10/12/2015 18:07   Ct Head Wo Contrast  Result Date: 10/12/2015 CLINICAL DATA:  Altered mental status for the past 6 hours, with confusion. EXAM: CT HEAD WITHOUT CONTRAST TECHNIQUE: Contiguous axial images were obtained from the base of the skull through the vertex without intravenous contrast. COMPARISON:  None. FINDINGS: Brain: Diffusely enlarged ventricles and subarachnoid spaces. Patchy white matter low density in both  cerebral hemispheres. Old right frontal lobe infarct additional old right frontal lobe lacunar infarct. Left occipital arachnoid cyst at the small communication with the occipital horn of the left lateral ventricle. No intracranial hemorrhage, mass lesion or CT evidence of acute infarction. Vascular: No hyperdense vessel or unexpected calcification. Skull: Normal. Negative for fracture or focal lesion. Sinuses/Orbits: Right mid ethmoid sinus mucosal thickening. Unremarkable orbits. Other: None. IMPRESSION: 1. No acute abnormality. 2. Mild to moderate diffuse cerebral atrophy and mild cerebellar atrophy. 3. 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 4. Mild chronic small vessel white matter ischemic changes in both cerebral hemispheres. Electronically Signed   By: Claudie Revering M.D.   On: 10/12/2015 18:06   Mr Brain Wo Contrast  Result Date: 10/13/2015 CLINICAL DATA:  Difficulty speaking and confusion, acute onset. EXAM: MRI HEAD WITHOUT CONTRAST TECHNIQUE: Multiplanar, multiecho pulse sequences of the brain and surrounding structures were obtained without intravenous contrast. COMPARISON:  Head CT 10/12/2015 FINDINGS: Brain: Diffusion imaging shows scattered acute infarctions within the left MCA territory affecting the deep insula, posterior temporal and temporoparietal region. No large confluent infarction. No mass  effect or hemorrhage. There is old infarction in the left occipital lobe which has progressed to encephalomalacia. There is old white matter ischemic change affecting the cerebral hemispheres most pronounced in the right posterior frontal deep white matter. There is an old cortical infarction at the right posterior frontal vertex. No mass lesion. No hydrocephalus or extra-axial collection. Vascular: Major vessels at the base of the brain show flow. Skull and upper cervical spine: Negative Sinuses/Orbits: Sinuses clear except for some fluid layering in the right maxillary sinus and a few scattered opacified ethmoid air cells. Previous cataract surgery. Other: None significant IMPRESSION: Scattered areas of acute infarction in the left middle cerebral artery territory affecting the deep insula, posterior temporal lobe and temporoparietal junction, consistent with embolic infarctions in a posterior division left MCA vessel. Old infarction in the left occipital lobe with encephalomalacia. Old infarction at the right posterior frontal vertex with encephalomalacia. Electronically Signed   By: Nelson Chimes M.D.   On: 10/13/2015 10:49       Assessment and plan discussed with with attending physician and they are in agreement.    Etta Quill PA-C [facility] [phone]  10/13/2015, 1:14 PM   Assessment: 82 y.o. female resenting to the hospital secondary to having acute onset of both expressive and receptive aphasia. MRI brain was obtained showing scattered left MCA emboli. EKG while in the emergency room showed normal sinus rhythm. Patient was not on aspirin daily.  Currently patient is showing both expressive and receptive aphasia with intermittent periods of ability to express herself.  Stroke Risk Factors - none  1. HgbA1c, fasting lipid panel 2. MRA  of the brain without contrast 3. PT consult, OT consult, Speech consult 4. Echocardiogram 5. Carotid dopplers 6. Prophylactic  therapy-Antiplatelet med: Aspirin - dose 81 mg daily 7. Risk factor modification 8. Telemetry monitoring 9. Frequent neuro checks 10 NPO until passes stroke swallow screen 11 please page stroke NP  Or  PA  Or MD from 8am -4 pm  as this patient from this time will be  followed by the stroke.   You can look them up on [email or web address]  Password [email or web address]

## 2015-10-13 NOTE — Evaluation (Signed)
Clinical/Bedside Swallow Evaluation Patient Details  Name: Darlene Flores MRN: [id number] Date of Birth: 06-Sep-1927  Today's Date: 10/13/2015 Time: SLP Start Time (ACUTE ONLY): [id number] SLP Stop Time (ACUTE ONLY): 1504 SLP Time Calculation (min) (ACUTE ONLY): 32 min  Past Medical History:  Past Medical History:  Diagnosis Date  . Cancer ([place])   . Kidney stones   . UTI (urinary tract infection)    Past Surgical History:  Past Surgical History:  Procedure Laterality Date  . ABDOMINAL HYSTERECTOMY    . COLON SURGERY     HPI:  Darlene Flores is a 82 y.o. female with history of colon cancer status post hemicolectomy being followed at [facility], history of recurrent UTI, kidney stones was brought to the ER after patient was found to have difficulty talking coherently. Patient's daughter stated that patient's symptoms started on the night of October 4th 8 PM while driving in the car with the patient. At that time the spell lasted for 15 minutes and got resolved. Yesterday when patient was taken to patient's routine appointment with general surgeon patient was unable to recall her birthday and was not able to bring out clear words. This persisted through the evening and patient was brought to the ER. CT head unremarkable. MRI brain was obtained showing scattered embolic strokes in the left MCA territory. Neurology was consult to see patient. Currently patient is alert and shows expressive and receptive aphasia.   Assessment / Plan / Recommendation Clinical Impression  Pt referred for clinical swallow assessment. Pt tolerated all PO intake WFL, but required cueing to take a sip to clear mild oral residue with dry solid. Recommend: regular diet with thin liquids. Intermittent supervision with PO intake.     Aspiration Risk  No limitations    Diet Recommendation Regular;Thin liquid   Liquid Administration via: Cup;Straw Medication Administration: Whole meds with liquid Supervision: Patient  able to self feed;Intermittent supervision to cue for compensatory strategies Compensations: Follow solids with liquid Postural Changes: Seated upright at 90 degrees    Other  Recommendations Oral Care Recommendations: Oral care QID   Follow up Recommendations  (ongoing SLP needed- recs pending other therapy disciplines)      Frequency and Duration            Prognosis Prognosis for Safe Diet Advancement: Good Barriers to Reach Goals: Language deficits      Swallow Study   General Date of Onset: 10/12/15 HPI: Darlene Flores is a 82 y.o. female with history of colon cancer status post hemicolectomy being followed at [facility], history of recurrent UTI, kidney stones was brought to the ER after patient was found to have difficulty talking coherently. Patient's daughter stated that patient's symptoms started on the night of October 4th 8 PM while driving in the car with the patient. At that time the spell lasted for 15 minutes and got resolved. Yesterday when patient was taken to patient's routine appointment with general surgeon patient was unable to recall her birthday and was not able to bring out clear words. This persisted through the evening and patient was brought to the ER. CT head unremarkable. MRI brain was obtained showing scattered embolic strokes in the left MCA territory. Neurology was consult to see patient. Currently patient is alert and shows expressive and receptive aphasia. Type of Study: Bedside Swallow Evaluation Diet Prior to this Study: Regular;Thin liquids Respiratory Status: Room air History of Recent Intubation: No Behavior/Cognition: Alert;Cooperative;Pleasant mood;Requires cueing Oral Cavity Assessment: Within Functional Limits Oral Care Completed  by SLP: No Oral Cavity - Dentition: Adequate natural dentition Vision: Functional for self-feeding Self-Feeding Abilities: Able to feed self Patient Positioning: Upright in bed Baseline Vocal Quality:  Normal Volitional Cough: Cognitively unable to elicit Volitional Swallow: Unable to elicit    Oral/Motor/Sensory Function Overall Oral Motor/Sensory Function: Within functional limits   Ice Chips Ice chips: Within functional limits   Thin Liquid Thin Liquid: Within functional limits    Nectar Thick     Honey Thick     Puree Puree: Within functional limits   Solid   GO   Solid: Impaired Presentation: Self Fed Oral Phase Functional Implications: Oral residue    Functional Assessment Tool Used: clinician judgement Functional Limitations: Swallowing Swallow Current Status [id number]): At least 1 percent but less than 20 percent impaired, limited or restricted Swallow Goal Status [phone]): At least 1 percent but less than 20 percent impaired, limited or restricted Swallow Discharge Status [phone]): At least 1 percent but less than 20 percent impaired, limited or restricted   Vinetta Bergamo MA, [place] Pager [phone] 10/13/2015,3:17 PM

## 2015-10-14 ENCOUNTER — Inpatient Hospital Stay (HOSPITAL_COMMUNITY): Payer: Medicare Other

## 2015-10-14 DIAGNOSIS — R4701 Aphasia: Secondary | ICD-10-CM

## 2015-10-14 LAB — BASIC METABOLIC PANEL
ANION GAP: 8 (ref 5–15)
BUN: 20 mg/dL (ref 6–20)
CALCIUM: 9 mg/dL (ref 8.9–10.3)
CO2: 21 mmol/L — ABNORMAL LOW (ref 22–32)
CREATININE: 1.2 mg/dL — AB (ref 0.44–1.00)
Chloride: 112 mmol/L — ABNORMAL HIGH (ref 101–111)
GFR, EST AFRICAN AMERICAN: 46 mL/min — AB (ref >60)
GFR, EST NON AFRICAN AMERICAN: 40 mL/min — AB (ref >60)
Glucose, Bld: 95 mg/dL (ref 65–99)
Potassium: 4.3 mmol/L (ref 3.5–5.1)
SODIUM: 141 mmol/L (ref 135–145)

## 2015-10-14 LAB — LIPID PANEL
CHOLESTEROL: 169 mg/dL (ref 0–200)
HDL: 35 mg/dL — AB (ref >40)
LDL CALC: 116 mg/dL — AB (ref 0–99)
TRIGLYCERIDES: 92 mg/dL (ref <150)
Total CHOL/HDL Ratio: 4.8 RATIO
VLDL: 18 mg/dL (ref 0–40)

## 2015-10-14 LAB — CBC WITH DIFFERENTIAL/PLATELET
BASOS ABS: 0 10*3/uL (ref 0.0–0.1)
BASOS PCT: 1 %
EOS ABS: 0.1 10*3/uL (ref 0.0–0.7)
Eosinophils Relative: 1 %
HEMATOCRIT: 41.9 % (ref 36.0–46.0)
HEMOGLOBIN: 12.8 g/dL (ref 12.0–15.0)
Lymphocytes Relative: 26 %
Lymphs Abs: 1.7 10*3/uL (ref 0.7–4.0)
MCH: 26.9 pg (ref 26.0–34.0)
MCHC: 30.5 g/dL (ref 30.0–36.0)
MCV: 88 fL (ref 78.0–100.0)
Monocytes Absolute: 0.8 10*3/uL (ref 0.1–1.0)
Monocytes Relative: 12 %
NEUTROS ABS: 4 10*3/uL (ref 1.7–7.7)
NEUTROS PCT: 60 %
Platelets: 193 10*3/uL (ref 150–400)
RBC: 4.76 MIL/uL (ref 3.87–5.11)
RDW: 14.4 % (ref 11.5–15.5)
WBC: 6.6 10*3/uL (ref 4.0–10.5)

## 2015-10-14 NOTE — Progress Notes (Signed)
VASCULAR LAB PRELIMINARY  PRELIMINARY  PRELIMINARY  PRELIMINARY  Carotid duplex completed.    Preliminary report:  1-39% ICA plaquing. Vertebral artery flow is antegrade.   Georgie Haque, RVT 10/14/2015, 6:17 PM

## 2015-10-14 NOTE — Progress Notes (Signed)
STROKE TEAM PROGRESS NOTE   HISTORY OF PRESENT ILLNESS (per record) Darlene Johnsonis a 82 y.o.femalewith history of colon cancer status post hemicolectomy being followed at [facility], history of recurrent UTI, kidney stones was brought to the ER after patient was found to have difficulty talking coherently. Patient's daughter stated that patient's symptoms started on the night of October 4th8 PM while driving in the car with the patient. At that time the spell lasted for 15 minutes and resolved. Yesterday when patient was taken to patient's routine appointment with general surgeon patient was unable to recall her birthday and was not able to bring out clear words. This persisted through the evening and patient was brought to the ER. CT head unremarkable. MRI brain was obtained showing scattered embolic strokes in the left MCA territory. Neurology was consult to see patient. Currently patient is alert and shows expressive and receptive aphasia.   Date last known well: Date: 10/11/2015 Time last known well: Time: 16:00 tPA Given: No: Out of window   SUBJECTIVE (INTERVAL HISTORY) No family members present. The patient is alert and able to follow some commands. She is expressively and receptively aphasic. No focal weakness on exam.   OBJECTIVE Temp:  [98 F (36.7 C)-98.6 F (37 C)] 98.6 F (37 C) (10/07 0531) Pulse Rate:  [74-80] 75 (10/07 0531) Cardiac Rhythm: Normal sinus rhythm;Heart block (10/07 0700) Resp:  [16] 16 (10/07 0531) BP: (134-154)/(60-83) 134/69 (10/07 0531) SpO2:  [94 %-98 %] 97 % (10/07 0531) Weight:  [92 kg (202 lb 14.4 oz)] 92 kg (202 lb 14.4 oz) (10/07 0531)  CBC:  Recent Labs Lab 10/12/15 1715 10/13/15 0102 10/14/15 0237  WBC 8.5 8.3 6.6  NEUTROABS 5.7  --  4.0  HGB 14.7 13.7 12.8  HCT 44.8 43.9 41.9  MCV 84.4 87.8 88.0  PLT 215 190 0000000    Basic Metabolic Panel:  Recent Labs Lab 10/13/15 0102 10/14/15 0237  NA 140 141  K 3.4* 4.3  CL 111  112*  CO2 20* 21*  GLUCOSE 184* 95  BUN 18 20  CREATININE 1.25* 1.20*  CALCIUM 9.2 9.0    Lipid Panel:    Component Value Date/Time   CHOL 169 10/14/2015 0237   TRIG 92 10/14/2015 0237   HDL 35 (L) 10/14/2015 0237   CHOLHDL 4.8 10/14/2015 0237   VLDL 18 10/14/2015 0237   LDLCALC 116 (H) 10/14/2015 0237   HgbA1c: No results found for: HGBA1C Urine Drug Screen: No results found for: LABOPIA, COCAINSCRNUR, LABBENZ, AMPHETMU, THCU, LABBARB    IMAGING  Dg Chest 2 View 10/12/2015 1. No acute abnormality.  2. Cardiomegaly with probable left ventricular hypertrophy.  3. Aortic atherosclerosis.    Ct Head Wo Contrast 10/12/2015 1. No acute abnormality.  2. Mild to moderate diffuse cerebral atrophy and mild cerebellar atrophy.  3. 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  4. Mild chronic small vessel white matter ischemic changes in both cerebral hemispheres.     Mr Brain Wo Contrast 10/13/2015 Scattered areas of acute infarction in the left middle cerebral artery territory affecting the deep insula, posterior temporal lobe and temporoparietal junction, consistent with embolic infarctions in a posterior division left MCA vessel.  Old infarction in the left occipital lobe with encephalomalacia.  Old infarction at the right posterior frontal vertex with encephalomalacia.    MRA Head without Contrast 10/14/2015 No proximal stenosis or occlusion. M2 branch vessel occlusion on the left.    PHYSICAL EXAM HEENT-  Normocephalic, no lesions, without obvious  abnormality.  Normal external eye and conjunctiva.  Normal TM's bilaterally.  Normal auditory canals and external ears. Normal external nose, mucus membranes and septum.  Normal pharynx. Cardiovascular- S1, S2 normal, pulses palpable throughout   Lungs- chest clear, no wheezing, rales, normal symmetric air entry Abdomen- normal findings: bowel sounds normal Extremities- no edema Lymph-no adenopathy palpable Musculoskeletal-no joint  tenderness, deformity or swelling Skin-warm and dry, no hyperpigmentation, vitiligo, or suspicious lesions  Neurological Examination Mental Status: Alert, patient shows expressive and receptive aphasia.  She was able to follow commands by mimicking my motions. Cranial Nerves: II: Discs flat bilaterally; Visual fields grossly normal, pupils equal, round, reactive to light and accommodation III,IV, VI: ptosis not present, extra-ocular motions intact bilaterally V,VII: smile symmetric, facial light touch sensation normal bilaterally VIII: hearing normal bilaterally IX,X: uvula rises symmetrically XI: bilateral shoulder shrug XII: midline tongue extension Motor: Right :  Upper extremity   5/5                                      Left:     Upper extremity   5/5             Lower extremity   5/5                                                  Lower extremity   5/5 Tone and bulk:normal tone throughout; no atrophy noted Sensory: Pinprick and light touch intact throughout, bilaterally Deep Tendon Reflexes: 2+ and symmetric throughout Plantars: Right: downgoing                                Left: downgoing Cerebellar: normal finger-to-nose,  and normal heel-to-shin test Gait: Not tested     ASSESSMENT/PLAN [name] is a 82 y.o. female with history of previous strokes by MRI, renal calculi, UTIs, and colon cancer presenting with speech difficulties.  She did not receive IV t-PA due to late presentation.  Strokes:  Dominant embolic infarcts in the left middle cerebral artery distribution - source unknown.  Resultant  receptive and expressive aphasia without hemiparesis  MRI - Scattered areas of acute infarction in the left middle cerebral artery territory. Remote infarcts.  MRA - No proximal stenosis or occlusion. M2 branch vessel occlusion on the left.  Carotid Doppler - pending  2D Echo - pending  LDL - 116  HgbA1c pending  VTE prophylaxis - Lovenox  Diet Heart  Room service appropriate? Yes; Fluid consistency: Thin  No antithrombotic prior to admission, now on aspirin 325 mg daily  Ongoing aggressive stroke risk factor management  Therapy recommendations: SNF recommended by speech therapy.  Disposition:  Pending  Hypertension  Stable  Permissive hypertension (OK if < 220/120) but gradually normalize in 5-7 days  Long-term BP goal normotensive  Hyperlipidemia  Home meds:  No lipid lowering medications prior to admission.  LDL 116, goal < 70  Now on Lipitor 40 mg daily  Continue statin at discharge    Other Stroke Risk Factors  Advanced age  Obesity, Body mass index is 34.83 kg/m., recommend weight loss, diet and exercise as appropriate   Hx stroke/TIA   Other Active Problems  Creatinine 1.20  PLAN May need TEE to look for  cardiac source of embolism and loop recorder for [facility] day # [place] PA-C [facility] Pager [phone] 10/14/2015, 2:59 PM I have personally examined this patient, reviewed notes, independently viewed imaging studies, participated in medical decision making and plan of care.ROS completed by me personally and pertinent positives fully documented  I have made any additions or clarifications directly to the above note. Agree with note above.  Patient has presented with global aphasia due to patchy posterior division MCA infarct and MRI also shows a couple of remote age embolic infarcts.   Recommend  TEE to look for  cardiac source of embolism and loop recorder for PAF  Family not available at the bedside for discussion. Greater than 50% time during this 35 minute visit were  spent on coordination of care for stroke evaluation and workup Antony Contras, [place] Pager: [phone] 10/14/2015 3:07 PM   To contact Stroke Continuity provider, please refer to [email or web address] After hours, contact General Neurology

## 2015-10-14 NOTE — Evaluation (Signed)
Physical Therapy Evaluation Patient Details Name: Darlene Flores MRN: [id number] DOB: Oct 20, 1927 Today's Date: 10/14/2015   History of Present Illness  Patient is an 82 yo female admitted 10/12/15 with difficulty with speech.  Patient with scatterred embolic strokes Lt MCA distribution.   PMH:  colon CA, recurrent UTI    Clinical Impression  Patient presents with problems listed below.  Will benefit from acute PT to maximize functional mobility prior to discharge.  Recommend SNF at discharge for continued therapy.    Follow Up Recommendations SNF;Supervision/Assistance - 24 hour    Equipment Recommendations  None recommended by PT    Recommendations for Other Services       Precautions / Restrictions Precautions Precautions: Fall Restrictions Weight Bearing Restrictions: No      Mobility  Bed Mobility Overal bed mobility: Needs Assistance Bed Mobility: Supine to Sit;Sit to Supine     Supine to sit: Mod assist Sit to supine: Mod assist   General bed mobility comments: Verbal and tactile cues to move to EOB.  Assist to raise trunk to sitting.  Assist to bring LE's onto bed to return to supine.  Transfers Overall transfer level: Needs assistance Equipment used: Rolling walker (2 wheeled);None Transfers: Sit to/from [facility] Sit to Stand: Mod assist Stand pivot transfers: Mod assist       General transfer comment: Verbal cues for technique for transfers bed <> BSC.  Required mod assist to rise to stance and to steady during transfers.  Patient with difficulty turning correctly to move safely between bed and BSC.  Patient then stood with use of RW and mod assist for gait.  Ambulation/Gait Ambulation/Gait assistance: Min assist Ambulation Distance (Feet): 20 Feet Assistive device: Rolling walker (2 wheeled) Gait Pattern/deviations: Step-through pattern;Decreased stride length;Shuffle;Trunk flexed Gait velocity: decreased   General Gait Details: Cues  to stand upright.  Patient with flexed posture and slightly unsteady gait pattern.  Stairs            Wheelchair Mobility    Modified Rankin (Stroke Patients Only) Modified Rankin (Stroke Patients Only) Pre-Morbid Rankin Score: Moderate disability Modified Rankin: Moderately severe disability     Balance Overall balance assessment: Needs assistance Sitting-balance support: No upper extremity supported;Feet supported Sitting balance-Leahy Scale: Fair     Standing balance support: Bilateral upper extremity supported Standing balance-Leahy Scale: Poor                               Pertinent Vitals/Pain Pain Assessment: No/denies pain    Home Living Family/patient expects to be discharged to:: Skilled nursing facility Living Arrangements: Children (Daughter) Available Help at Discharge: Family (Daughter has surgery 11/08/15)                  Prior Function Level of Independence: Independent with assistive device(s) (Unsure)         Comments: Patient reports she uses RW. Unable to provide further PLOF information.     Hand Dominance        Extremity/Trunk Assessment   Upper Extremity Assessment: Overall WFL for tasks assessed           Lower Extremity Assessment: Generalized weakness         Communication   Communication: Receptive difficulties;Expressive difficulties  Cognition Arousal/Alertness: Awake/alert Behavior During Therapy: Anxious;Impulsive ("Will you help me") Overall Cognitive Status: Difficult to assess  General Comments      Exercises     Assessment/Plan    PT Assessment Patient needs continued PT services  PT Problem List Decreased strength;Decreased activity tolerance;Decreased balance;Decreased mobility;Decreased cognition;Decreased knowledge of use of DME;Decreased safety awareness          PT Treatment Interventions DME instruction;Gait training;Functional mobility  training;Therapeutic activities;Balance training;Neuromuscular re-education;Cognitive remediation;Patient/family education    PT Goals (Current goals can be found in the Care Plan section)  Acute Rehab PT Goals Patient Stated Goal: "Will you help me" To get better PT Goal Formulation: With patient Time For Goal Achievement: 10/28/15 Potential to Achieve Goals: Good    Frequency Min 3X/week   Barriers to discharge Decreased caregiver support Daughter to have surgery 11/1.    Co-evaluation               End of Session Equipment Utilized During Treatment: Gait belt Activity Tolerance: Patient limited by fatigue Patient left: in bed;with call bell/phone within reach;with bed alarm set Nurse Communication: Mobility status         Time: [id number] PT Time Calculation (min) (ACUTE ONLY): 21 min   Charges:   PT Evaluation $PT Eval Moderate Complexity: 1 Procedure     PT G CodesDespina Pole 2015/10/16, 6:21 PM Carita Pian. Sanjuana Kava, [place] Pager [phone]

## 2015-10-14 NOTE — Progress Notes (Signed)
PROGRESS NOTE [facility]   Darlene Flores   [id number] DOB: [birth year]  DOA: 10/12/2015 PCP: Red Christians, MD   Brief Narrative:  Darlene Flores a 82 y.o.femalewith history of colon cancer status post hemicolectomy being followed at [facility], history of recurrent UTI, kidney stones was brought to the ER after patient was found to have difficulty talking coherently. Patient's daughter stated that patient's symptoms started on the night of October 4th8 PM while driving in the car with the patient. At that time the spell lasted for 15 minutes and got resolved. Then symptoms appeared at her doctor office and she was brought to the ER In the ED CT showed no acute hemorrhagic event. MRI was done showing embolic strokes in the left MCA territory. Patient admitted for stroke magement    Subjective:  Patient seen and examined at with family bedside. Patient wording seems to have improved since yesterday. No acute events overnight. No other changes  Assessment & Plan:   1. Acute Left MCA ischemic stroke with receptive and expressive aphasia - Neuro recommendtaions appreciated, f/u ECHO, Lab work up, for possible SNF placement. Follow PT recs 2. Elevated troponin - Asymptomatic, unclear etiology a this time  3. Elevated blood pressure - since patient has stroke will allow for permissive hypertension  4. History of recurrent UTI - UA-shows features concerning for UTI but patient asymptomatic afebrile and denies any dysuria.Cultures growing Klebsiella/ per daughter her kidney doctor told her to no use abx for common asymptomatic UTI. Will follow sensitivities.   DVT prophylaxis: Lovenox Code Status: Full Family Communication: Family at bedside. Daughter and Son.  Disposition Plan: Possible 24-48 hrs to SNF  Consultants:   Neurology    Procedures:   ECHO, Carotid doppler, MRA/MRI   Antimicrobials:  Rocephin 1 dose 10/13/15   Objective: Vitals:   10/13/15 2131  10/14/15 0022 10/14/15 0531 10/14/15 1210  BP: (!) 154/67 (!) 154/60 134/69 (!) 158/57  Pulse: 78 74 75 80  Resp: 16 16 16 18   Temp: 98 F (36.7 C) 98.4 F (36.9 C) 98.6 F (37 C) 98.7 F (37.1 C)  TempSrc: Oral Oral Oral Oral  SpO2: 98% 94% 97% 93%  Weight:   92 kg (202 lb 14.4 oz)   Height:        Intake/Output Summary (Last 24 hours) at 10/14/15 1319 Last data filed at 10/14/15 0600  Gross per 24 hour  Intake             1360 ml  Output                1 ml  Net             1359 ml   Filed Weights   10/12/15 2202 10/13/15 0518 10/14/15 0531  Weight: 90.9 kg (200 lb 4.8 oz) 91.4 kg (201 lb 8 oz) 92 kg (202 lb 14.4 oz)    Examination:  General exam: Appears calm and comfortable  HEENT: AC/AT, PERRLA, OP moist and clear Respiratory system: Clear to auscultation. No wheezes,crackle or rhonchi Cardiovascular system: S1 & S2 heard, RRR. No JVD, murmurs, rubs or gallops Gastrointestinal system: Abdomen is nondistended, soft and nontender. No organomegaly or masses felt Central nervous system: patient shows expressive and receptive aphasia, slight blunting of the nasolabial fold, sensation intact  Extremities: 1+ pitting edema. Symmetric, strength 5/5   Skin: No rashes, lesions or ulcers Psychiatry: Judgement and insight appear normal. Mood & affect appropriate.    Data Reviewed: I  have personally reviewed following labs and imaging studies  CBC:  Recent Labs Lab 10/12/15 1715 10/13/15 0102 10/14/15 0237  WBC 8.5 8.3 6.6  NEUTROABS 5.7  --  4.0  HGB 14.7 13.7 12.8  HCT 44.8 43.9 41.9  MCV 84.4 87.8 88.0  PLT 215 190 0000000   Basic Metabolic Panel:  Recent Labs Lab 10/12/15 1715 10/13/15 0102 10/14/15 0237  NA 138 140 141  K 3.9 3.4* 4.3  CL 109 111 112*  CO2 21* 20* 21*  GLUCOSE 121* 184* 95  BUN 22* 18 20  CREATININE 1.23* 1.25* 1.20*  CALCIUM 9.6 9.2 9.0   GFR: Estimated Creatinine Clearance: 37 mL/min (by C-G formula based on SCr of 1.2 mg/dL  (H)). Liver Function Tests:  Recent Labs Lab 10/12/15 1715 10/13/15 0102  AST 14* 14*  ALT 9* 7*  ALKPHOS 64 57  BILITOT 0.6 0.4  PROT 7.1 5.9*  ALBUMIN 4.1 3.3*   No results for input(s): LIPASE, AMYLASE in the last 168 hours. No results for input(s): AMMONIA in the last 168 hours. Coagulation Profile: No results for input(s): INR, PROTIME in the last 168 hours. Cardiac Enzymes:  Recent Labs Lab 10/12/15 1715 10/13/15 1024 10/13/15 1201 10/13/15 1931  TROPONINI 0.03* <0.03 <0.03 0.03*   BNP (last 3 results) No results for input(s): PROBNP in the last 8760 hours. HbA1C: No results for input(s): HGBA1C in the last 72 hours. CBG:  Recent Labs Lab 10/12/15 1705  GLUCAP 119*   Lipid Profile:  Recent Labs  10/14/15 0237  CHOL 169  HDL 35*  LDLCALC 116*  TRIG 92  CHOLHDL 4.8   Thyroid Function Tests: No results for input(s): TSH, T4TOTAL, FREET4, T3FREE, THYROIDAB in the last 72 hours. Anemia Panel: No results for input(s): VITAMINB12, FOLATE, FERRITIN, TIBC, IRON, RETICCTPCT in the last 72 hours. Sepsis Labs: No results for input(s): PROCALCITON, LATICACIDVEN in the last 168 hours.  Recent Results (from the past 240 hour(s))  Urine culture     Status: Abnormal (Preliminary result)   Collection Time: 10/12/15  6:08 PM  Result Value Ref Range Status   Specimen Description URINE, CATHETERIZED  Final   Special Requests NONE  Final   Culture >=100,000 COLONIES/mL KLEBSIELLA PNEUMONIAE (A)  Final   Report Status PENDING  Incomplete     Radiology Studies: Dg Chest 2 View  Result Date: 10/12/2015 CLINICAL DATA:  Altered mental status with confusion for the past 6 hours. EXAM: CHEST  2 VIEW COMPARISON:  None. FINDINGS: Enlarged cardiac silhouette with a prominent left ventricular contour. Tortuous and calcified thoracic aorta. Clear lungs with mildly prominent interstitial markings. No pleural fluid. Thoracic spine degenerative changes. Bilateral shoulder  degenerative changes with evidence of chronic bilateral rotator cuff tears. Diffuse osteopenia. IMPRESSION: 1. No acute abnormality. 2. Cardiomegaly with probable left ventricular hypertrophy. 3. Aortic atherosclerosis. Electronically Signed   By: Claudie Revering M.D.   On: 10/12/2015 18:07   Ct Head Wo Contrast  Result Date: 10/12/2015 CLINICAL DATA:  Altered mental status for the past 6 hours, with confusion. EXAM: CT HEAD WITHOUT CONTRAST TECHNIQUE: Contiguous axial images were obtained from the base of the skull through the vertex without intravenous contrast. COMPARISON:  None. FINDINGS: Brain: Diffusely enlarged ventricles and subarachnoid spaces. Patchy white matter low density in both cerebral hemispheres. Old right frontal lobe infarct additional old right frontal lobe lacunar infarct. Left occipital arachnoid cyst at the small communication with the occipital horn of the left lateral ventricle. No intracranial hemorrhage, mass  lesion or CT evidence of acute infarction. Vascular: No hyperdense vessel or unexpected calcification. Skull: Normal. Negative for fracture or focal lesion. Sinuses/Orbits: Right mid ethmoid sinus mucosal thickening. Unremarkable orbits. Other: None. IMPRESSION: 1. No acute abnormality. 2. Mild to moderate diffuse cerebral atrophy and mild cerebellar atrophy. 3. 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 4. Mild chronic small vessel white matter ischemic changes in both cerebral hemispheres. Electronically Signed   By: Claudie Revering M.D.   On: 10/12/2015 18:06   Mr Jodene Nam Head Wo Contrast  Result Date: 10/14/2015 CLINICAL DATA:  Difficulty speaking confusion. Acute infarctions demonstrated yesterday in the left middle cerebral artery territory. EXAM: MRA HEAD WITHOUT CONTRAST TECHNIQUE: Angiographic images of the Circle of Willis were obtained using MRA technique without intravenous contrast. COMPARISON:  MRI 10/13/2015.  CT 10/12/2015. FINDINGS: Both internal carotid arteries are widely  patent through the skullbase. The anterior and middle cerebral vessels are patent proximally. Right sided anterior circulation vessels are normal. On the left, the anterior cerebral artery appears normal. There is M2 occlusion of posterior MCA branches. Both vertebral arteries are widely patent to the basilar. No basilar stenosis. Posterior circulation branch vessels are normal. IMPRESSION: No proximal stenosis or occlusion. M2 branch vessel occlusion on the left. Electronically Signed   By: Nelson Chimes M.D.   On: 10/14/2015 11:27   Mr Brain Wo Contrast  Result Date: 10/13/2015 CLINICAL DATA:  Difficulty speaking and confusion, acute onset. EXAM: MRI HEAD WITHOUT CONTRAST TECHNIQUE: Multiplanar, multiecho pulse sequences of the brain and surrounding structures were obtained without intravenous contrast. COMPARISON:  Head CT 10/12/2015 FINDINGS: Brain: Diffusion imaging shows scattered acute infarctions within the left MCA territory affecting the deep insula, posterior temporal and temporoparietal region. No large confluent infarction. No mass effect or hemorrhage. There is old infarction in the left occipital lobe which has progressed to encephalomalacia. There is old white matter ischemic change affecting the cerebral hemispheres most pronounced in the right posterior frontal deep white matter. There is an old cortical infarction at the right posterior frontal vertex. No mass lesion. No hydrocephalus or extra-axial collection. Vascular: Major vessels at the base of the brain show flow. Skull and upper cervical spine: Negative Sinuses/Orbits: Sinuses clear except for some fluid layering in the right maxillary sinus and a few scattered opacified ethmoid air cells. Previous cataract surgery. Other: None significant IMPRESSION: Scattered areas of acute infarction in the left middle cerebral artery territory affecting the deep insula, posterior temporal lobe and temporoparietal junction, consistent with embolic  infarctions in a posterior division left MCA vessel. Old infarction in the left occipital lobe with encephalomalacia. Old infarction at the right posterior frontal vertex with encephalomalacia. Electronically Signed   By: Nelson Chimes M.D.   On: 10/13/2015 10:49    Scheduled Meds: . aspirin  300 mg Rectal Daily   Or  . aspirin  325 mg Oral Daily  . atorvastatin  40 mg Oral q1800  . enoxaparin (LOVENOX) injection  40 mg Subcutaneous Q24H  . psyllium  1 packet Oral BID   Continuous Infusions: . sodium chloride 50 mL/hr at 10/13/15 1957     LOS: 1 day    Chipper Oman, MD [facility] Pager [phone]  If 7PM-7AM, please contact night-coverage [email or web address] Password [email or web address] 10/14/2015, 1:19 PM

## 2015-10-15 DIAGNOSIS — N390 Urinary tract infection, site not specified: Secondary | ICD-10-CM

## 2015-10-15 LAB — URINE CULTURE: Culture: >=100000 — AB

## 2015-10-15 LAB — VAS US CAROTID
LCCADDIAS: -8 cm/s
LEFT ECA DIAS: -6 cm/s
LEFT VERTEBRAL DIAS: 12 cm/s
LICADDIAS: -16 cm/s
LICAPDIAS: -11 cm/s
LICAPSYS: -69 cm/s
Left CCA dist sys: -55 cm/s
Left CCA prox dias: 7 cm/s
Left CCA prox sys: 61 cm/s
Left ICA dist sys: -66 cm/s
RIGHT ECA DIAS: -12 cm/s
RIGHT VERTEBRAL DIAS: -16 cm/s
Right CCA prox dias: 12 cm/s
Right CCA prox sys: 76 cm/s
Right cca dist sys: -47 cm/s

## 2015-10-15 LAB — HEMOGLOBIN A1C
HEMOGLOBIN A1C: 5.3 % (ref 4.8–5.6)
Mean Plasma Glucose: 105 mg/dL

## 2015-10-15 MED ORDER — SULFAMETHOXAZOLE-TRIMETHOPRIM 800-160 MG PO TABS
1.0000 | ORAL_TABLET | Freq: Two times a day (BID) | ORAL | Status: DC
  Administered 2015-10-15 – 2015-10-16 (×2): 1 via ORAL
  Filled 2015-10-15 (×2): qty 1

## 2015-10-15 NOTE — Progress Notes (Signed)
STROKE TEAM PROGRESS NOTE   HISTORY OF PRESENT ILLNESS (per record) Darlene Johnsonis a 82 y.o.femalewith history of colon cancer status post hemicolectomy being followed at [facility], history of recurrent UTI, kidney stones was brought to the ER after patient was found to have difficulty talking coherently. Patient's daughter stated that patient's symptoms started on the night of October 4th8 PM while driving in the car with the patient. At that time the spell lasted for 15 minutes and resolved. Yesterday when patient was taken to patient's routine appointment with general surgeon patient was unable to recall her birthday and was not able to bring out clear words. This persisted through the evening and patient was brought to the ER. CT head unremarkable. MRI brain was obtained showing scattered embolic strokes in the left MCA territory. Neurology was consult to see patient. Currently patient is alert and shows expressive and receptive aphasia.   Date last known well: Date: 10/11/2015 Time last known well: Time: 16:00 tPA Given: No: Out of window   SUBJECTIVE (INTERVAL HISTORY) Daughter is present. The patient is alert and able to follow some commands. She is able to speak short sentences and comprehend better now. No focal weakness on exam.   OBJECTIVE Temp:  [97.7 F (36.5 C)-98.5 F (36.9 C)] 98.5 F (36.9 C) (10/08 1303) Pulse Rate:  [74-82] 74 (10/08 1303) Cardiac Rhythm: Normal sinus rhythm (10/08 0814) Resp:  [18] 18 (10/08 1303) BP: (110-161)/(70-96) 161/70 (10/08 1303) SpO2:  [95 %-97 %] 96 % (10/08 1303) Weight:  [204 lb 14.4 oz (92.9 kg)] 204 lb 14.4 oz (92.9 kg) (10/08 0646)  CBC:   Recent Labs Lab 10/12/15 1715 10/13/15 0102 10/14/15 0237  WBC 8.5 8.3 6.6  NEUTROABS 5.7  --  4.0  HGB 14.7 13.7 12.8  HCT 44.8 43.9 41.9  MCV 84.4 87.8 88.0  PLT 215 190 0000000    Basic Metabolic Panel:   Recent Labs Lab 10/13/15 0102 10/14/15 0237  NA 140 141  K 3.4*  4.3  CL 111 112*  CO2 20* 21*  GLUCOSE 184* 95  BUN 18 20  CREATININE 1.25* 1.20*  CALCIUM 9.2 9.0    Lipid Panel:     Component Value Date/Time   CHOL 169 10/14/2015 0237   TRIG 92 10/14/2015 0237   HDL 35 (L) 10/14/2015 0237   CHOLHDL 4.8 10/14/2015 0237   VLDL 18 10/14/2015 0237   LDLCALC 116 (H) 10/14/2015 0237   HgbA1c: No results found for: HGBA1C Urine Drug Screen: No results found for: LABOPIA, COCAINSCRNUR, LABBENZ, AMPHETMU, THCU, LABBARB    IMAGING  Dg Chest 2 View 10/12/2015 1. No acute abnormality.  2. Cardiomegaly with probable left ventricular hypertrophy.  3. Aortic atherosclerosis.    Ct Head Wo Contrast 10/12/2015 1. No acute abnormality.  2. Mild to moderate diffuse cerebral atrophy and mild cerebellar atrophy.  3. 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  4. Mild chronic small vessel white matter ischemic changes in both cerebral hemispheres.     Mr Brain Wo Contrast 10/13/2015 Scattered areas of acute infarction in the left middle cerebral artery territory affecting the deep insula, posterior temporal lobe and temporoparietal junction, consistent with embolic infarctions in a posterior division left MCA vessel.  Old infarction in the left occipital lobe with encephalomalacia.  Old infarction at the right posterior frontal vertex with encephalomalacia.    MRA Head without Contrast 10/14/2015 No proximal stenosis or occlusion. M2 branch vessel occlusion on the left.    PHYSICAL EXAM HEENT-  Normocephalic, no lesions, without obvious abnormality.  Normal external eye and conjunctiva.  Normal TM's bilaterally.  Normal auditory canals and external ears. Normal external nose, mucus membranes and septum.  Normal pharynx. Cardiovascular- S1, S2 normal, pulses palpable throughout   Lungs- chest clear, no wheezing, rales, normal symmetric air entry Abdomen- normal findings: bowel sounds normal Extremities- no edema Lymph-no adenopathy  palpable Musculoskeletal-no joint tenderness, deformity or swelling Skin-warm and dry, no hyperpigmentation, vitiligo, or suspicious lesions  Neurological Examination Mental Status: Alert, patient shows  Mild expressive and receptive aphasia.  She was able to follow commands , name and repeat simple words. Cranial Nerves: II: Discs flat bilaterally; Visual fields grossly normal, pupils equal, round, reactive to light and accommodation III,IV, VI: ptosis not present, extra-ocular motions intact bilaterally V,VII: smile symmetric, facial light touch sensation normal bilaterally VIII: hearing normal bilaterally IX,X: uvula rises symmetrically XI: bilateral shoulder shrug XII: midline tongue extension Motor: Right :  Upper extremity   5/5                                      Left:     Upper extremity   5/5             Lower extremity   5/5                                                  Lower extremity   5/5 Tone and bulk:normal tone throughout; no atrophy noted Sensory: Pinprick and light touch intact throughout, bilaterally Deep Tendon Reflexes: 2+ and symmetric throughout Plantars: Right: downgoing                                Left: downgoing Cerebellar: normal finger-to-nose,  and normal heel-to-shin test Gait: Not tested     ASSESSMENT/PLAN [name] is a 82 y.o. female with history of previous strokes by MRI, renal calculi, UTIs, and colon cancer presenting with speech difficulties.  She did not receive IV t-PA due to late presentation.  Strokes:  Dominant embolic infarcts in the left middle cerebral artery distribution - source unknown.  Resultant  receptive and expressive aphasia without hemiparesis  MRI - Scattered areas of acute infarction in the left middle cerebral artery territory. Remote infarcts.  MRA - No proximal stenosis or occlusion. M2 branch vessel occlusion on the left.  Carotid Doppler -1-39% ICA plaquing. Vertebral artery flow is antegrade.    2D Echo - pending  LDL - 116  HgbA1c pending  VTE prophylaxis - Lovenox Diet Heart Room service appropriate? Yes; Fluid consistency: Thin Diet NPO time specified Except for: Sips with Meds  No antithrombotic prior to admission, now on aspirin 325 mg daily  Ongoing aggressive stroke risk factor management  Therapy recommendations: SNF recommended by speech therapy.  Disposition:  Pending  Hypertension  Stable  Permissive hypertension (OK if < 220/120) but gradually normalize in 5-7 days  Long-term BP goal normotensive  Hyperlipidemia  Home meds:  No lipid lowering medications prior to admission.  LDL 116, goal < 70  Now on Lipitor 40 mg daily  Continue statin at discharge    Other Stroke Risk Factors  Advanced age  Obesity, Body mass  index is 35.17 kg/m., recommend weight loss, diet and exercise as appropriate   Hx stroke/TIA   Other Active Problems  Creatinine 1.20  PLAN May need TEE to look for  cardiac source of embolism and loop recorder for PAF D/w daughter and answered questions   Hospital day # 2  Mikey Bussing PA-C [facility] Pager [phone] 10/15/2015, 1:17 PM I have personally examined this patient, reviewed notes, independently viewed imaging studies, participated in medical decision making and plan of care.ROS completed by me personally and pertinent positives fully documented  I have made any additions or clarifications directly to the above note.    Patient has presented with global aphasia due to patchy posterior division MCA infarct and MRI also shows a couple of remote age embolic infarcts.   Recommend  TEE to look for  cardiac source of embolism and loop recorder for PAF   D/w daughter at the bedside   . Greater than 50% time during this 25 minute visit were  spent on coordination of care for stroke evaluation and workup Antony Contras, [place] Pager: [phone] 10/15/2015 1:17  PM   To contact Stroke Continuity provider, please refer to [email or web address] After hours, contact General Neurology

## 2015-10-15 NOTE — Progress Notes (Addendum)
PROGRESS NOTE [facility]   Darlene Flores   [id number] DOB: 07-13-1927  DOA: 10/12/2015 PCP: Red Christians, MD   Brief Narrative:  Darlene Flores a 82 y.o.femalewith history of colon cancer status post hemicolectomy being followed at [facility], history of recurrent UTI, kidney stones was brought to the ER after patient was found to have difficulty talking coherently. Patient's daughter stated that patient's symptoms started on the night of October 4th8 PM while driving in the car with the patient. At that time the spell lasted for 15 minutes and got resolved. Then symptoms appeared at her doctor office and she was brought to the ER In the ED CT showed no acute hemorrhagic event. MRI was done showing embolic strokes in the left MCA territory. Patient admitted for stroke magement    Subjective:  Patient seen and examined at with family bedside. The patient is alert and able to follow some commands. She is able to speak short sentences and comprehend better now. No acute events overnight. No other changes  Assessment & Plan:   1. Acute Left MCA ischemic stroke with receptive and expressive aphasia - Neuro recommendtaions appreciated on ASA and statin, SNF placement - social work consulted. Follow PT recs, for TEE tomorrow 2. Elevated troponin - Asymptomatic, unclear etiology a this time  3. Elevated blood pressure - since patient has stroke will allow for permissive hypertension (ok 220/120) 4. History of recurrent UTI - UA-shows features concerning for UTI but patient asymptomatic afebrile and denies any dysuria.Cultures growing Klebsiella/ Pan sensitive Per daughter her kidney doctor told her to no use abx for common asymptomatic UTI. Given patien with AMS and is hospitalized will start Bactrim CrCl 49   DVT prophylaxis: Lovenox Code Status: Full Family Communication: Family at bedside. Daughter and Son.  Disposition Plan: For TEE tomorrow. Possible 24-48 hrs to  SNF  Consultants:   Neurology    Procedures:   ECHO, Carotid doppler, MRA/MRI   Antimicrobials:  Rocephin 1 dose 10/13/15   Objective: Vitals:   10/14/15 1210 10/14/15 2007 10/15/15 0646 10/15/15 1303  BP: (!) 158/57 (!) 110/96 111/86 (!) 161/70  Pulse: 80 81 82 74  Resp: 18 18 18 18   Temp: 98.7 F (37.1 C) 97.7 F (36.5 C) 97.8 F (36.6 C) 98.5 F (36.9 C)  TempSrc: Oral Oral Oral Oral  SpO2: 93% 95% 97% 96%  Weight:   92.9 kg (204 lb 14.4 oz)   Height:        Intake/Output Summary (Last 24 hours) at 10/15/15 1614 Last data filed at 10/15/15 1407  Gross per 24 hour  Intake             1940 ml  Output              300 ml  Net             1640 ml   Filed Weights   10/13/15 0518 10/14/15 0531 10/15/15 0646  Weight: 91.4 kg (201 lb 8 oz) 92 kg (202 lb 14.4 oz) 92.9 kg (204 lb 14.4 oz)    Examination:  General exam: Appears calm and comfortable  Respiratory system: Clear to auscultation. No wheezes,crackle or rhonchi Cardiovascular system: S1 & S2 heard, RRR. No JVD, murmurs, rubs or gallops Gastrointestinal system: Abdomen is nondistended, soft and nontender. No organomegaly or masses felt Central nervous system: patient shows expressive and receptive aphasia improving , slight blunting of the nasolabial fold, sensation intact  Extremities: 1+ pitting edema. Symmetric, strength  5/5   Skin: No rashes, lesions or ulcers Psychiatry: Judgement and insight appear normal. Mood & affect appropriate.    Data Reviewed: I have personally reviewed following labs and imaging studies  CBC:  Recent Labs Lab 10/12/15 1715 10/13/15 0102 10/14/15 0237  WBC 8.5 8.3 6.6  NEUTROABS 5.7  --  4.0  HGB 14.7 13.7 12.8  HCT 44.8 43.9 41.9  MCV 84.4 87.8 88.0  PLT 215 190 0000000   Basic Metabolic Panel:  Recent Labs Lab 10/12/15 1715 10/13/15 0102 10/14/15 0237  NA 138 140 141  K 3.9 3.4* 4.3  CL 109 111 112*  CO2 21* 20* 21*  GLUCOSE 121* 184* 95  BUN 22* 18 20   CREATININE 1.23* 1.25* 1.20*  CALCIUM 9.6 9.2 9.0   GFR: Estimated Creatinine Clearance: 37.2 mL/min (by C-G formula based on SCr of 1.2 mg/dL (H)). Liver Function Tests:  Recent Labs Lab 10/12/15 1715 10/13/15 0102  AST 14* 14*  ALT 9* 7*  ALKPHOS 64 57  BILITOT 0.6 0.4  PROT 7.1 5.9*  ALBUMIN 4.1 3.3*   No results for input(s): LIPASE, AMYLASE in the last 168 hours. No results for input(s): AMMONIA in the last 168 hours. Coagulation Profile: No results for input(s): INR, PROTIME in the last 168 hours. Cardiac Enzymes:  Recent Labs Lab 10/12/15 1715 10/13/15 1024 10/13/15 1201 10/13/15 1931  TROPONINI 0.03* <0.03 <0.03 0.03*   BNP (last 3 results) No results for input(s): PROBNP in the last 8760 hours. HbA1C:  Recent Labs  10/14/15 0237  HGBA1C 5.3   CBG:  Recent Labs Lab 10/12/15 1705  GLUCAP 119*   Lipid Profile:  Recent Labs  10/14/15 0237  CHOL 169  HDL 35*  LDLCALC 116*  TRIG 92  CHOLHDL 4.8   Thyroid Function Tests: No results for input(s): TSH, T4TOTAL, FREET4, T3FREE, THYROIDAB in the last 72 hours. Anemia Panel: No results for input(s): VITAMINB12, FOLATE, FERRITIN, TIBC, IRON, RETICCTPCT in the last 72 hours. Sepsis Labs: No results for input(s): PROCALCITON, LATICACIDVEN in the last 168 hours.  Recent Results (from the past 240 hour(s))  Urine culture     Status: Abnormal   Collection Time: 10/12/15  6:08 PM  Result Value Ref Range Status   Specimen Description URINE, CATHETERIZED  Final   Special Requests NONE  Final   Culture (A)  Final    >=100,000 COLONIES/mL KLEBSIELLA PNEUMONIAE >=100,000 COLONIES/mL AEROCOCCUS URINAE Standardized susceptibility testing for this organism is not available. Performed at [facility]    Report Status 10/15/2015 FINAL  Final   Organism ID, Bacteria KLEBSIELLA PNEUMONIAE (A)  Final      Susceptibility   Klebsiella pneumoniae - MIC*    AMPICILLIN >=32 RESISTANT Resistant      CEFAZOLIN <=4 SENSITIVE Sensitive     CEFTRIAXONE <=1 SENSITIVE Sensitive     CIPROFLOXACIN <=0.25 SENSITIVE Sensitive     GENTAMICIN <=1 SENSITIVE Sensitive     IMIPENEM <=0.25 SENSITIVE Sensitive     NITROFURANTOIN 64 INTERMEDIATE Intermediate     TRIMETH/SULFA <=20 SENSITIVE Sensitive     AMPICILLIN/SULBACTAM 8 SENSITIVE Sensitive     PIP/TAZO <=4 SENSITIVE Sensitive     Extended ESBL NEGATIVE Sensitive     * >=100,000 COLONIES/mL KLEBSIELLA PNEUMONIAE     Radiology Studies: Mr Virgel Paling [id number] Contrast  Result Date: 10/14/2015 CLINICAL DATA:  Difficulty speaking confusion. Acute infarctions demonstrated yesterday in the left middle cerebral artery territory. EXAM: MRA HEAD WITHOUT CONTRAST TECHNIQUE: Angiographic images of  the Circle of Willis were obtained using MRA technique without intravenous contrast. COMPARISON:  MRI 10/13/2015.  CT 10/12/2015. FINDINGS: Both internal carotid arteries are widely patent through the skullbase. The anterior and middle cerebral vessels are patent proximally. Right sided anterior circulation vessels are normal. On the left, the anterior cerebral artery appears normal. There is M2 occlusion of posterior MCA branches. Both vertebral arteries are widely patent to the basilar. No basilar stenosis. Posterior circulation branch vessels are normal. IMPRESSION: No proximal stenosis or occlusion. M2 branch vessel occlusion on the left. Electronically Signed   By: Nelson Chimes M.D.   On: 10/14/2015 11:27    Scheduled Meds: . aspirin  300 mg Rectal Daily   Or  . aspirin  325 mg Oral Daily  . atorvastatin  40 mg Oral q1800  . enoxaparin (LOVENOX) injection  40 mg Subcutaneous Q24H  . psyllium  1 packet Oral BID   Continuous Infusions: . sodium chloride 50 mL/hr at 10/14/15 1755     LOS: 2 days    Darlene Oman, MD [facility] Pager [phone]  If 7PM-7AM, please contact night-coverage [email or web address] Password [email or web address] 10/15/2015, 4:14 PM

## 2015-10-16 ENCOUNTER — Other Ambulatory Visit (HOSPITAL_COMMUNITY): Payer: Medicare Other

## 2015-10-16 ENCOUNTER — Inpatient Hospital Stay (HOSPITAL_COMMUNITY): Payer: Medicare Other

## 2015-10-16 ENCOUNTER — Encounter (HOSPITAL_COMMUNITY): Payer: Self-pay

## 2015-10-16 DIAGNOSIS — I639 Cerebral infarction, unspecified: Secondary | ICD-10-CM

## 2015-10-16 DIAGNOSIS — I6789 Other cerebrovascular disease: Secondary | ICD-10-CM

## 2015-10-16 DIAGNOSIS — R4701 Aphasia: Secondary | ICD-10-CM

## 2015-10-16 LAB — ECHOCARDIOGRAM COMPLETE
HEIGHTINCHES: 64 in
Weight: 3260.8 oz

## 2015-10-16 MED ORDER — STROKE: EARLY STAGES OF RECOVERY BOOK
Freq: Once | Status: DC
  Filled 2015-10-16: qty 1

## 2015-10-16 MED ORDER — CEPHALEXIN 500 MG PO CAPS
500.0000 mg | ORAL_CAPSULE | Freq: Two times a day (BID) | ORAL | Status: AC
  Administered 2015-10-16 – 2015-10-18 (×4): 500 mg via ORAL
  Filled 2015-10-16 (×4): qty 1

## 2015-10-16 NOTE — Care Management Important Message (Signed)
Important Message  Patient Details  Name: Darlene Flores MRN: [id number] Date of Birth: 02/14/1927   Medicare Important Message Given:  Yes    Aubrey Voong 10/16/2015, 11:10 AM

## 2015-10-16 NOTE — Progress Notes (Signed)
Physical Therapy Treatment Patient Details Name: Darlene Flores MRN: [id number] DOB: December 06, 1927 Today's Date: 10/16/2015    History of Present Illness Patient is an 82 yo female admitted 10/12/15 with difficulty with speech.  Patient with scatterred embolic strokes Lt MCA distribution.   PMH:  colon CA, recurrent UTI    PT Comments    Pt able to increase ambulation distance today.  She does best with visual and tactile cueing.  Con't to recommend SNF.  Follow Up Recommendations  SNF;Supervision/Assistance - 24 hour     Equipment Recommendations  None recommended by PT    Recommendations for Other Services       Precautions / Restrictions Precautions Precautions: Fall Restrictions Weight Bearing Restrictions: No    Mobility  Bed Mobility Overal bed mobility: Needs Assistance Bed Mobility: Supine to Sit     Supine to sit: Min assist     General bed mobility comments: Pt able to bring legs to EOB, but needed A to get trunk upright  Transfers Overall transfer level: Needs assistance Equipment used: Rolling walker (2 wheeled);None Transfers: Sit to/from Stand Sit to Stand: Mod assist Stand pivot transfers: Mod assist       General transfer comment:  (multi- modal cueing for location of BSC with SPT.)  Ambulation/Gait Ambulation/Gait assistance: Min assist Ambulation Distance (Feet): 55 Feet Assistive device: Rolling walker (2 wheeled) Gait Pattern/deviations: Trunk flexed;Step-through pattern Gait velocity: decreased   General Gait Details: Cues for posture with gait due to trunk flexion and cues for positioning within RW   Stairs            Wheelchair Mobility    Modified Rankin (Stroke Patients Only) Modified Rankin (Stroke Patients Only) Pre-Morbid Rankin Score: Moderate disability Modified Rankin: Moderately severe disability     Balance Overall balance assessment: Needs assistance Sitting-balance support: No upper extremity  supported Sitting balance-Leahy Scale: Fair Sitting balance - Comments: Pt was able to weight shift forward multiple times in sitting to wipe self after using BSC   Standing balance support: Bilateral upper extremity supported Standing balance-Leahy Scale: Poor                      Cognition Arousal/Alertness: Awake/alert Behavior During Therapy: Anxious ("will you help me?" ) Overall Cognitive Status: Difficult to assess (due to global apahsia)                      Exercises      General Comments        Pertinent Vitals/Pain Pain Assessment: No/denies pain    Home Living                      Prior Function            PT Goals (current goals can now be found in the care plan section) Acute Rehab PT Goals Patient Stated Goal: "Will you help me" To get better PT Goal Formulation: With patient Time For Goal Achievement: 10/28/15 Potential to Achieve Goals: Good Progress towards PT goals: Progressing toward goals    Frequency    Min 3X/week      PT Plan Current plan remains appropriate    Co-evaluation             End of Session Equipment Utilized During Treatment: Gait belt Activity Tolerance: Patient tolerated treatment well Patient left: in chair;with call bell/phone within reach;with family/visitor present;Other (comment) (Family with pt and aware to not leave  her alone)     Time: [id number] PT Time Calculation (min) (ACUTE ONLY): 23 min  Charges:  $Gait Training: 8-22 mins $Therapeutic Activity: 8-22 mins                    G Codes:      Darlene Flores 10/16/2015, 10:03 AM

## 2015-10-16 NOTE — Consult Note (Signed)
ELECTROPHYSIOLOGY CONSULT NOTE  Patient ID: Darlene Flores MRN: [id number], DOB/AGE: [birth year]   Admit date: 10/12/2015 Date of Consult: 10/16/2015  Primary Physician: Red Christians, MD Primary Cardiologist: none Reason for Consultation: Cryptogenic stroke ; recommendations regarding Implantable Loop Recorder Requesting MD: Dr. Erlinda Hong  History of Present Illness Darlene Flores was admitted on 10/12/2015 with acute stroke.  They first developed symptoms while initially days prior to her admission that were transient and the day of admission again though persistent associated with some confusion.  PMHx of recurrent UTI's and kidney stones, fairly  recent bowel surgery in Feb secondary to ca  Imaging demonstrated left MCA territory scattered infarcts, embolic from unknown source.  she has undergone workup for stroke including echocardiogram that is pending and carotid dopplers.  The patient has been monitored on telemetry which has demonstrated sinus rhythm with no arrhythmias.  Inpatient stroke work-up is to be completed with a TEE tomorrow.  Echocardiogram this admission Pending   Lab work is reviewed.  Prior to admission, the patient denies chest pain, shortness of breath, dizziness, palpitations, or syncope.  They are recovering from their stroke with plans to rehab at discharge.  EP has been asked to evaluate for placement of an implantable loop recorder to monitor for atrial fibrillation.     Past Medical History:  Diagnosis Date  . Cancer ([place])   . Kidney stones   . UTI (urinary tract infection)      Surgical History:  Past Surgical History:  Procedure Laterality Date  . ABDOMINAL HYSTERECTOMY    . COLON SURGERY       Prescriptions Prior to Admission  Medication Sig Dispense Refill Last Dose  . psyllium (METAMUCIL) 58.6 % packet Take 1 packet by mouth at bedtime.    10/12/2015 at Unknown time    Inpatient Medications:  . aspirin  300 mg Rectal Daily   Or  . aspirin   325 mg Oral Daily  . atorvastatin  40 mg Oral q1800  . enoxaparin (LOVENOX) injection  40 mg Subcutaneous Q24H  . psyllium  1 packet Oral BID  . sulfamethoxazole-trimethoprim  1 tablet Oral Q12H    Allergies:  Allergies  Allergen Reactions  . Ivp Dye [Iodinated Diagnostic Agents] Other (See Comments)    Was told not take/use due to Shellfish allergy  . Shellfish Allergy Other (See Comments)    vomiting  . Iodine Other (See Comments)    Blisters on her skin    Social History   Social History  . Marital status: Widowed    Spouse name: N/A  . Number of children: N/A  . Years of education: N/A   Occupational History  . Not on file.   Social History Main Topics  . Smoking status: Never Smoker  . Smokeless tobacco: Never Used  . Alcohol use No  . Drug use: No  . Sexual activity: Not on file   Other Topics Concern  . Not on file   Social History Narrative  . No narrative on file     Family History  Problem Relation Age of Onset  . Lymphoma Mother   . CAD Father   . Melanoma Daughter       Review of Systems: All other systems reviewed and are otherwise negative except as noted above.  Physical Exam: Vitals:   10/15/15 1303 10/15/15 2047 10/16/15 0405 10/16/15 1154  BP: (!) 161/70 (!) 176/70  (!) 168/77  Pulse: 74 75  84  Resp: 18 18  16  Temp: 98.5 F (36.9 C) 98.2 F (36.8 C)  98.9 F (37.2 C)  TempSrc: Oral Oral  Oral  SpO2: 96% 94%    Weight:   203 lb 12.8 oz (92.4 kg)   Height:        GEN- The patient is elderly and frail appearing, alert and oriented x 3 today.   Head- normocephalic, atraumatic Eyes-  Sclera clear, conjunctiva pink Ears- hearing intact Oropharynx- clear Neck- supple Lungs- Clear to ausculation bilaterally, normal work of breathing Heart- Regular rate and rhythm, no murmurs, rubs or gallops  GI- soft, NT, ND Extremities- no clubbing, cyanosis, or edema MS- no significant deformity, age appropriate atrophy Skin- no rash or  lesion Psych- euthymic mood, full affect   Labs:   Lab Results  Component Value Date   WBC 6.6 10/14/2015   HGB 12.8 10/14/2015   HCT 41.9 10/14/2015   MCV 88.0 10/14/2015   PLT 193 10/14/2015    Recent Labs Lab 10/13/15 0102 10/14/15 0237  NA 140 141  K 3.4* 4.3  CL 111 112*  CO2 20* 21*  BUN 18 20  CREATININE 1.25* 1.20*  CALCIUM 9.2 9.0  PROT 5.9*  --   BILITOT 0.4  --   ALKPHOS 57  --   ALT 7*  --   AST 14*  --   GLUCOSE 184* 95   Lab Results  Component Value Date   TROPONINI 0.03 (HH) 10/13/2015   Lab Results  Component Value Date   CHOL 169 10/14/2015   Lab Results  Component Value Date   HDL 35 (L) 10/14/2015   Lab Results  Component Value Date   LDLCALC 116 (H) 10/14/2015   Lab Results  Component Value Date   TRIG 92 10/14/2015   Lab Results  Component Value Date   CHOLHDL 4.8 10/14/2015   No results found for: LDLDIRECT  No results found for: DDIMER   Radiology/Studies:  Dg Chest 2 View Result Date: 10/12/2015 CLINICAL DATA:  Altered mental status with confusion for the past 6 hours. EXAM: CHEST  2 VIEW COMPARISON:  None. FINDINGS: Enlarged cardiac silhouette with a prominent left ventricular contour. Tortuous and calcified thoracic aorta. Clear lungs with mildly prominent interstitial markings. No pleural fluid. Thoracic spine degenerative changes. Bilateral shoulder degenerative changes with evidence of chronic bilateral rotator cuff tears. Diffuse osteopenia. IMPRESSION: 1. No acute abnormality. 2. Cardiomegaly with probable left ventricular hypertrophy. 3. Aortic atherosclerosis. Electronically Signed   By: Claudie Revering M.D.   On: 10/12/2015 18:07   Ct Head Wo Contrast Result Date: 10/12/2015 CLINICAL DATA:  Altered mental status for the past 6 hours, with confusion. EXAM: CT HEAD WITHOUT CONTRAST TECHNIQUE: Contiguous axial images were obtained from the base of the skull through the vertex without intravenous contrast. COMPARISON:  None.  FINDINGS: Brain: Diffusely enlarged ventricles and subarachnoid spaces. Patchy white matter low density in both cerebral hemispheres. Old right frontal lobe infarct additional old right frontal lobe lacunar infarct. Left occipital arachnoid cyst at the small communication with the occipital horn of the left lateral ventricle. No intracranial hemorrhage, mass lesion or CT evidence of acute infarction. Vascular: No hyperdense vessel or unexpected calcification. Skull: Normal. Negative for fracture or focal lesion. Sinuses/Orbits: Right mid ethmoid sinus mucosal thickening. Unremarkable orbits. Other: None. IMPRESSION: 1. No acute abnormality. 2. Mild to moderate diffuse cerebral atrophy and mild cerebellar atrophy. 3. 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 4. Mild chronic small vessel white matter ischemic changes in both cerebral  hemispheres. Electronically Signed   By: Claudie Revering M.D.   On: 10/12/2015 18:06   Mr Jodene Nam Head Wo Contrast Result Date: 10/14/2015 CLINICAL DATA:  Difficulty speaking confusion. Acute infarctions demonstrated yesterday in the left middle cerebral artery territory. EXAM: MRA HEAD WITHOUT CONTRAST TECHNIQUE: Angiographic images of the Circle of Willis were obtained using MRA technique without intravenous contrast. COMPARISON:  MRI 10/13/2015.  CT 10/12/2015. FINDINGS: Both internal carotid arteries are widely patent through the skullbase. The anterior and middle cerebral vessels are patent proximally. Right sided anterior circulation vessels are normal. On the left, the anterior cerebral artery appears normal. There is M2 occlusion of posterior MCA branches. Both vertebral arteries are widely patent to the basilar. No basilar stenosis. Posterior circulation branch vessels are normal. IMPRESSION: No proximal stenosis or occlusion. M2 branch vessel occlusion on the left. Electronically Signed   By: Nelson Chimes M.D.   On: 10/14/2015 11:27   Mr Brain Wo Contrast Result Date: 10/13/2015 CLINICAL  DATA:  Difficulty speaking and confusion, acute onset. EXAM: MRI HEAD WITHOUT CONTRAST TECHNIQUE: Multiplanar, multiecho pulse sequences of the brain and surrounding structures were obtained without intravenous contrast. COMPARISON:  Head CT 10/12/2015 FINDINGS: Brain: Diffusion imaging shows scattered acute infarctions within the left MCA territory affecting the deep insula, posterior temporal and temporoparietal region. No large confluent infarction. No mass effect or hemorrhage. There is old infarction in the left occipital lobe which has progressed to encephalomalacia. There is old white matter ischemic change affecting the cerebral hemispheres most pronounced in the right posterior frontal deep white matter. There is an old cortical infarction at the right posterior frontal vertex. No mass lesion. No hydrocephalus or extra-axial collection. Vascular: Major vessels at the base of the brain show flow. Skull and upper cervical spine: Negative Sinuses/Orbits: Sinuses clear except for some fluid layering in the right maxillary sinus and a few scattered opacified ethmoid air cells. Previous cataract surgery. Other: None significant IMPRESSION: Scattered areas of acute infarction in the left middle cerebral artery territory affecting the deep insula, posterior temporal lobe and temporoparietal junction, consistent with embolic infarctions in a posterior division left MCA vessel. Old infarction in the left occipital lobe with encephalomalacia. Old infarction at the right posterior frontal vertex with encephalomalacia. Electronically Signed   By: Nelson Chimes M.D.   On: 10/13/2015 10:49    12-lead ECG SR All prior EKG's in EPIC reviewed with no documented atrial fibrillation  Telemetry SR only  Assessment and Plan:  1. Cryptogenic stroke The patient presents with cryptogenic stroke.  The patient has a TEE planned for this AM.  I spoke at length with the patient and her daughter about monitoring for afib with  either a 30 day event monitor or an implantable loop recorder.  Risks, benefits, and alteratives to implantable loop recorder were discussed with the patient today.   At this time, the patient they both are very clear in their decision to proceed with implantable loop recorder.   Wound care was reviewed with the patient (keep incision clean and dry for 3 days).  Wound check will be scheduled.  Please call with questions.   Renee Dyane Dustman, PA-C 10/16/2015   I have seen, examined the patient, and reviewed the above assessment and plan.  Changes to above are made where necessary.   On exam, confused.  Frail and elderly. Awaiting 2D echo and TEE.  If unrevealing, could consider implantable loop recorder vs 30 day event monitor.  Pts daughter will contemplate  options with [place].  Co Sign: Thompson Grayer, MD 10/16/2015 4:24 PM

## 2015-10-16 NOTE — NC FL2 (Signed)
  [place] LEVEL OF CARE SCREENING TOOL     IDENTIFICATION  Patient Name: Darlene Flores Birthdate: 09-Aug-1927 Sex: female Admission Date (Current Location): 10/12/2015  [facility] and [place] Number:  Herbalist and Address:  [facility] [place] [place] [address] [place], [place] [address]      Provider Number: [id number]  Attending Physician Name and Address:  Doreatha Lew, MD  Relative Name and Phone Number:       Current Level of Care: Hospital Recommended Level of Care: [place] Prior Approval Number:    Date Approved/Denied:   PASRR Number: [id number]  Discharge Plan: SNF    Current Diagnoses: Patient Active Problem List   Diagnosis Date Noted  . Urinary tract infection without hematuria   . Receptive aphasia 10/13/2015  . Elevated blood pressure reading 10/13/2015  . History of colon cancer 10/13/2015  . Aphasia 10/13/2015  . Acute CVA (cerebrovascular accident) ([place]) 10/13/2015  . Altered mental status   . Acute confusion 10/12/2015    Orientation RESPIRATION BLADDER Height & Weight     Self  Normal Incontinent Weight: 203 lb 12.8 oz (92.4 kg) (scale a) Height:  5\' 4"  (162.6 cm)  BEHAVIORAL SYMPTOMS/MOOD NEUROLOGICAL BOWEL NUTRITION STATUS   (None)  (None) Continent Diet (Heart healthy)  AMBULATORY STATUS COMMUNICATION OF NEEDS Skin   Limited Assist Verbally Other (Comment) (MASD)                       Personal Care Assistance Level of Assistance  Bathing, Feeding, Dressing Bathing Assistance: Limited assistance Feeding assistance: Independent Dressing Assistance: Limited assistance     Functional Limitations Info  Sight, Hearing, Speech Sight Info: Adequate Hearing Info: Adequate Speech Info: Adequate    SPECIAL CARE FACTORS FREQUENCY  PT (By licensed PT), Speech therapy, Blood pressure     PT Frequency: 5 x week       Speech Therapy Frequency: 5 x week      Contractures  Contractures Info: Not present    Additional Factors Info  Code Status, Allergies Code Status Info: Full Allergies Info: Ivp Dye (Iodinated Diagnostic Agents, Shellfish allergy, Iodine           Current Medications (10/16/2015):  This is the current hospital active medication list Current Facility-Administered Medications  Medication Dose Route Frequency Provider Last Rate Last Dose  . aspirin suppository 300 mg  300 mg Rectal Daily Rise Patience, MD       Or  . aspirin tablet 325 mg  325 mg Oral Daily Rise Patience, MD   325 mg at 10/16/15 1045  . atorvastatin (LIPITOR) tablet 40 mg  40 mg Oral q1800 Doreatha Lew, MD   40 mg at 10/15/15 1718  . enoxaparin (LOVENOX) injection 40 mg  40 mg Subcutaneous Q24H Rise Patience, MD   40 mg at 10/16/15 1045  . psyllium (HYDROCIL/METAMUCIL) packet 1 packet  1 packet Oral BID Rise Patience, MD   1 packet at 10/16/15 1046  . sulfamethoxazole-trimethoprim (BACTRIM DS,SEPTRA DS) 800-160 MG per tablet 1 tablet  1 tablet Oral Q12H Doreatha Lew, MD   1 tablet at 10/16/15 1045     Discharge Medications: Please see discharge summary for a list of discharge medications.  Relevant Imaging Results:  Relevant Lab Results:   Additional Information SS#: [id number]  Candie Chroman, LCSW

## 2015-10-16 NOTE — Progress Notes (Signed)
PROGRESS NOTE [facility]   Suellyn Saracino   [id number] DOB: October 17, 1927  DOA: 10/12/2015 PCP: Red Christians, MD   Brief Narrative:  Darlene Flores a 82 y.o.femalewith history of colon cancer status post hemicolectomy being followed at [facility], history of recurrent UTI, kidney stones was brought to the ER after patient was found to have difficulty talking coherently. Patient's daughter stated that patient's symptoms started on the night of October 4th8 PM while driving in the car with the patient. At that time the spell lasted for 15 minutes and got resolved. Then symptoms appeared at her doctor office and she was brought to the ER In the ED CT showed no acute hemorrhagic event. MRI was done showing embolic strokes in the left MCA territory. Patient admitted for stroke magement    Subjective:  Patient seen and examined at with family bedside. Continues to improves, no acute changes getting treated for UTI now.   Assessment & Plan:   1. Acute Left MCA ischemic stroke with receptive and expressive aphasia - Neuro recommendtaions appreciated on ASA and statin, SNF placement - social work consulted. Follow PT recs, for TEE tomorrow 2. Elevated troponin - Asymptomatic, unclear etiology a this time  3. Elevated blood pressure - since patient has stroke will allow for permissive hypertension (ok 220/120), should normalized in 2-3 days, Continue to monitor  4. History of recurrent UTI - UA-shows features concerning for UTI but patient asymptomatic afebrile and denies any dysuria.Cultures growing Klebsiella/ Pan sensitive Per daughter her kidney doctor told her to no use abx for common asymptomatic UTI. Given patien with AMS and is hospitalized will start Bactrim CrCl 49   DVT prophylaxis: Lovenox Code Status: Full Family Communication: Family at bedside. Daughter and Son.  Disposition Plan: For TEE tomorrow. Possible 24-48 hrs to SNF  Consultants:   Neurology     Procedures:   ECHO, Carotid doppler, MRA/MRI   Antimicrobials:  Rocephin 1 dose 10/13/15   Objective: Vitals:   10/15/15 1303 10/15/15 2047 10/16/15 0405 10/16/15 1154  BP: (!) 161/70 (!) 176/70  (!) 168/77  Pulse: 74 75  84  Resp: 18 18  16   Temp: 98.5 F (36.9 C) 98.2 F (36.8 C)  98.9 F (37.2 C)  TempSrc: Oral Oral  Oral  SpO2: 96% 94%    Weight:   92.4 kg (203 lb 12.8 oz)   Height:        Intake/Output Summary (Last 24 hours) at 10/16/15 1514 Last data filed at 10/16/15 1348  Gross per 24 hour  Intake             2960 ml  Output              573 ml  Net             2387 ml   Filed Weights   10/14/15 0531 10/15/15 0646 10/16/15 0405  Weight: 92 kg (202 lb 14.4 oz) 92.9 kg (204 lb 14.4 oz) 92.4 kg (203 lb 12.8 oz)    Examination:  General exam: Appears calm and comfortable  Respiratory system: Clear to auscultation. No wheezes,crackle or rhonchi Cardiovascular system: S1 & S2 heard, RRR. No JVD, murmurs, rubs or gallops Central nervous system: patient shows expressive and receptive aphasia improving , slight blunting of the nasolabial fold, sensation intact  Extremities: 1+ pitting edema. Symmetric, strength 5/5   Skin: No rashes, lesions or ulcers Psychiatry: Judgement and insight appear normal. Mood & affect appropriate.    Data  Reviewed: I have personally reviewed following labs and imaging studies  CBC:  Recent Labs Lab 10/12/15 1715 10/13/15 0102 10/14/15 0237  WBC 8.5 8.3 6.6  NEUTROABS 5.7  --  4.0  HGB 14.7 13.7 12.8  HCT 44.8 43.9 41.9  MCV 84.4 87.8 88.0  PLT 215 190 0000000   Basic Metabolic Panel:  Recent Labs Lab 10/12/15 1715 10/13/15 0102 10/14/15 0237  NA 138 140 141  K 3.9 3.4* 4.3  CL 109 111 112*  CO2 21* 20* 21*  GLUCOSE 121* 184* 95  BUN 22* 18 20  CREATININE 1.23* 1.25* 1.20*  CALCIUM 9.6 9.2 9.0   GFR: Estimated Creatinine Clearance: 37.1 mL/min (by C-G formula based on SCr of 1.2 mg/dL (H)). Liver Function  Tests:  Recent Labs Lab 10/12/15 1715 10/13/15 0102  AST 14* 14*  ALT 9* 7*  ALKPHOS 64 57  BILITOT 0.6 0.4  PROT 7.1 5.9*  ALBUMIN 4.1 3.3*   No results for input(s): LIPASE, AMYLASE in the last 168 hours. No results for input(s): AMMONIA in the last 168 hours. Coagulation Profile: No results for input(s): INR, PROTIME in the last 168 hours. Cardiac Enzymes:  Recent Labs Lab 10/12/15 1715 10/13/15 1024 10/13/15 1201 10/13/15 1931  TROPONINI 0.03* <0.03 <0.03 0.03*   BNP (last 3 results) No results for input(s): PROBNP in the last 8760 hours. HbA1C:  Recent Labs  10/14/15 0237  HGBA1C 5.3   CBG:  Recent Labs Lab 10/12/15 1705  GLUCAP 119*   Lipid Profile:  Recent Labs  10/14/15 0237  CHOL 169  HDL 35*  LDLCALC 116*  TRIG 92  CHOLHDL 4.8   Thyroid Function Tests: No results for input(s): TSH, T4TOTAL, FREET4, T3FREE, THYROIDAB in the last 72 hours. Anemia Panel: No results for input(s): VITAMINB12, FOLATE, FERRITIN, TIBC, IRON, RETICCTPCT in the last 72 hours. Sepsis Labs: No results for input(s): PROCALCITON, LATICACIDVEN in the last 168 hours.  Recent Results (from the past 240 hour(s))  Urine culture     Status: Abnormal   Collection Time: 10/12/15  6:08 PM  Result Value Ref Range Status   Specimen Description URINE, CATHETERIZED  Final   Special Requests NONE  Final   Culture (A)  Final    >=100,000 COLONIES/mL KLEBSIELLA PNEUMONIAE >=100,000 COLONIES/mL AEROCOCCUS URINAE Standardized susceptibility testing for this organism is not available. Performed at [facility]    Report Status 10/15/2015 FINAL  Final   Organism ID, Bacteria KLEBSIELLA PNEUMONIAE (A)  Final      Susceptibility   Klebsiella pneumoniae - MIC*    AMPICILLIN >=32 RESISTANT Resistant     CEFAZOLIN <=4 SENSITIVE Sensitive     CEFTRIAXONE <=1 SENSITIVE Sensitive     CIPROFLOXACIN <=0.25 SENSITIVE Sensitive     GENTAMICIN <=1 SENSITIVE Sensitive     IMIPENEM  <=0.25 SENSITIVE Sensitive     NITROFURANTOIN 64 INTERMEDIATE Intermediate     TRIMETH/SULFA <=20 SENSITIVE Sensitive     AMPICILLIN/SULBACTAM 8 SENSITIVE Sensitive     PIP/TAZO <=4 SENSITIVE Sensitive     Extended ESBL NEGATIVE Sensitive     * >=100,000 COLONIES/mL KLEBSIELLA PNEUMONIAE     Radiology Studies: No results found.  Scheduled Meds: .  stroke: mapping our early stages of recovery book   Does not apply Once  . aspirin  300 mg Rectal Daily   Or  . aspirin  325 mg Oral Daily  . atorvastatin  40 mg Oral q1800  . enoxaparin (LOVENOX) injection  40 mg Subcutaneous  Q24H  . psyllium  1 packet Oral BID  . sulfamethoxazole-trimethoprim  1 tablet Oral Q12H   Continuous Infusions:     LOS: 3 days    Chipper Oman, MD [facility] Pager [phone]  If 7PM-7AM, please contact night-coverage [email or web address] Password [email or web address] 10/16/2015, 3:14 PM

## 2015-10-16 NOTE — Clinical Social Work Placement (Signed)
   CLINICAL SOCIAL WORK PLACEMENT  NOTE  Date:  10/16/2015  Patient Details  Name: Darlene Flores MRN: [id number] Date of Birth: 02-Nov-1927  Clinical Social Work is seeking post-discharge placement for this patient at the [place] level of care (*CSW will initial, date and re-position this form in  chart as items are completed):  Yes   Patient/family provided with [place] Work Department's list of facilities offering this level of care within the geographic area requested by the patient (or if unable, by the patient's family).  Yes   Patient/family informed of their freedom to choose among providers that offer the needed level of care, that participate in Medicare, Medicaid or managed care program needed by the patient, have an available bed and are willing to accept the patient.  Yes   Patient/family informed of [place]'s ownership interest in [facility] and [facility], as well as of the fact that they are under no obligation to receive care at these facilities.  PASRR submitted to EDS on 10/16/15     PASRR number received on       Existing PASRR number confirmed on 10/16/15     FL2 transmitted to all facilities in geographic area requested by pt/family on 10/16/15     FL2 transmitted to all facilities within larger geographic area on       Patient informed that his/her managed care company has contracts with or will negotiate with certain facilities, including the following:            Patient/family informed of bed offers received.  Patient chooses bed at       Physician recommends and patient chooses bed at      Patient to be transferred to   on  .  Patient to be transferred to facility by       Patient family notified on   of transfer.  Name of family member notified:        PHYSICIAN Please sign FL2     Additional Comment:    _______________________________________________ Candie Chroman, LCSW 10/16/2015, 11:11  AM

## 2015-10-16 NOTE — Progress Notes (Signed)
Speech Language Pathology Treatment: Cognitive-Linquistic  Patient Details Name: Darlene Flores MRN: [id number] DOB: 31-Aug-1927 Today's Date: 10/16/2015 Time: [id number] SLP Time Calculation (min) (ACUTE ONLY): 17 min  Assessment / Plan / Recommendation Clinical Impression  PT with notable improvements in communication.  She is expressing herself in fluent utterances, with ongoing word-retrieval deficits, but spontaneity and accuracy of output is improved.  Pt able to follow one-step commands with mod verbal/visual cues.  Named to confrontation with 80% accuracy for common objects in room; cloze phrase cues provided for errors with noted generalization by end of session.  Recommend continued SLP for aphasia while here and at SNF.  Dtr present for education. Will follow.   HPI HPI: Darlene Flores is a 82 y.o. female with history of colon cancer status post hemicolectomy being followed at [facility], history of recurrent UTI, kidney stones was brought to the ER after patient was found to have difficulty talking coherently. Patient's daughter stated that patient's symptoms started on the night of October 4th 8 PM while driving in the car with the patient. At that time the spell lasted for 15 minutes and got resolved. Yesterday when patient was taken to patient's routine appointment with general surgeon patient was unable to recall her birthday and was not able to bring out clear words. This persisted through the evening and patient was brought to the ER. CT head unremarkable. MRI brain was obtained showing scattered embolic strokes in the left MCA territory. Neurology was consult to see patient. Currently patient is alert and shows expressive and receptive aphasia.      SLP Plan  Continue with current plan of care     Recommendations                   Follow up Recommendations: Skilled Nursing facility Plan: Continue with current plan of care       [place].  Tivis Ringer, [place] CCC/SLP Pager [phone]  Juan Quam Laurice 10/16/2015, 10:52 AM

## 2015-10-16 NOTE — Clinical Social Work Note (Signed)
Clinical Social Work Assessment  Patient Details  Name: Darlene Flores MRN: [id number] Date of Birth: 07/17/1927  Date of referral:  10/16/15               Reason for consult:  Facility Placement, Discharge Planning                Permission sought to share information with:  Facility Sport and exercise psychologist, Family Supports Permission granted to share information::  Yes, Verbal Permission Granted  Name::     Asencion Islam  Agency::  SNF's  Relationship::  Daughter  Contact Information:  [phone]  Housing/Transportation Living arrangements for the past 2 months:  [place] of Information:  Medical Team, Adult Children Patient Interpreter Needed:  None Criminal Activity/Legal Involvement Pertinent to Current Situation/Hospitalization:  No - Comment as needed Significant Relationships:  Adult Children Lives with:  Adult Children Do you feel safe going back to the place where you live?  Yes Need for family participation in patient care:  Yes (Comment)  Care giving concerns:  PT recommending SNF once medically stable for discharge.   Social Worker assessment / plan:  CSW met with patient. Daughter at bedside. Patient oriented only to self. CSW introduced role and explained that PT recommendations would be discussed. Patient's daughter agreeable to SNF placement with an emphasis on speech therapy. Patient's daughter asked about scores provided on SNF list. Explained reason for scores listed. Discussed preferenced SNF's. Will call first preference, [facility]. No further concerns. CSW encouraged patient's daughter to contact CSW as needed. CSW will continue to follow patient and her daughter for support and facilitate discharge to SNF once medically stable.  Employment status:  Retired Nurse, adult PT Recommendations:  [place] / Referral to community resources:  [place]  Patient/Family's Response  to care:  Patient oriented only to self. Patient's daughter agreeable to SNF placement. Patient's children supportive and involved in patient's care. Patient's daughter appreciated social work intervention.  Patient/Family's Understanding of and Emotional Response to Diagnosis, Current Treatment, and Prognosis:  Patient oriented only to self. Patient's daughter understands the need for rehab prior to returning home. Patient's daughter appears happy with hospital care.  Emotional Assessment Appearance:  Appears stated age Attitude/Demeanor/Rapport:  Other (Pleasant) Affect (typically observed):  Appropriate, Calm, Pleasant Orientation:  Oriented to Self Alcohol / Substance use:  Never Used Psych involvement (Current and /or in the community):  No (Comment)  Discharge Needs  Concerns to be addressed:  Care Coordination Readmission within the last 30 days:  No Current discharge risk:  Cognitively Impaired, Dependent with Mobility Barriers to Discharge:  No Barriers Identified   Candie Chroman, LCSW 10/16/2015, 11:08 AM

## 2015-10-16 NOTE — Evaluation (Signed)
Occupational Therapy Evaluation Patient Details Name: Darlene Flores MRN: [id number] DOB: [birth year] Today's Date: 10/16/2015    History of Present Illness Patient is an 82 yo female admitted 10/12/15 with difficulty with speech.  Patient with scatterred embolic strokes Lt MCA distribution.   PMH:  colon CA, recurrent UTI   Clinical Impression   Per daughter, pt walks without a device and performs self care at a modified independent level. She does not wear socks and daughter reports difficulty with thoroughness with pericare. Pt typically sleeps in a recliner and currently requires assist for bed mobility as a result. Pt with difficulty following commands and appears to have difficulty with motor planning, needing further assessment. Will follow acutely.    Follow Up Recommendations  SNF;Supervision/Assistance - 24 hour    Equipment Recommendations       Recommendations for Other Services       Precautions / Restrictions Precautions Precautions: Fall Restrictions Weight Bearing Restrictions: No      Mobility Bed Mobility Overal bed mobility: Needs Assistance Bed Mobility: Sit to Supine       Sit to supine: Mod assist   General bed mobility comments: assist for LEs up in bed, pt sleeps in a recliner normally  Transfers Overall transfer level: Needs assistance Equipment used: 1 person hand held assist Transfers: Sit to/from Stand Sit to Stand: Min assist Stand pivot transfers: Min assist       General transfer comment: from [facility] to bed    Balance     Sitting balance-Leahy Scale: Fair       Standing balance-Leahy Scale: Poor                              ADL Overall ADL's : Needs assistance/impaired Eating/Feeding: Set up;Bed level   Grooming: Wash/dry hands;Wash/dry face;Sitting;Set up   Upper Body Bathing: Minimal assitance   Lower Body Bathing: Moderate assistance;Sit to/from stand   Upper Body Dressing : Minimal assistance;Sitting    Lower Body Dressing: Moderate assistance;Sit to/from stand Lower Body Dressing Details (indicate cue type and reason): pt wears slip on shoes Toilet Transfer: Minimal assistance;Stand-pivot   Toileting- Clothing Manipulation and Hygiene: Moderate assistance;Sit to/from stand Toileting - Clothing Manipulation Details (indicate cue type and reason): assist after BM, daughter states this is typical for her, asking about AD             Vision     Perception     Praxis      Pertinent Vitals/Pain Pain Assessment: No/denies pain     Hand Dominance Right   Extremity/Trunk Assessment Upper Extremity Assessment Upper Extremity Assessment: RUE deficits/detail;LUE deficits/detail RUE Deficits / Details: longstanding shoulder limitations 2* OA LUE Deficits / Details: longstanding shoulder limitations due to OA,crepitus   Lower Extremity Assessment Lower Extremity Assessment: Defer to PT evaluation       Communication Communication Communication: Receptive difficulties;Expressive difficulties   Cognition Arousal/Alertness: Awake/alert Behavior During Therapy: WFL for tasks assessed/performed Overall Cognitive Status: Difficult to assess                     General Comments       Exercises       Shoulder Instructions      Home Living Family/patient expects to be discharged to:: Skilled nursing facility Living Arrangements: Children Available Help at Discharge: Family (daughter has impending surgery)  Lives With: Daughter    Prior Functioning/Environment Level of Independence: Needs assistance  Gait / Transfers Assistance Needed: resistant to walker use, has had 1 fall ADL's / Homemaking Assistance Needed: wears slip on shoes, showers on tub bench, helps with cooking and light housekeeping   Comments: per daughter        OT Problem List: Decreased strength;Impaired balance (sitting and/or standing);Decreased  coordination;Decreased cognition;Decreased knowledge of use of DME or AE;Obesity;Decreased range of motion   OT Treatment/Interventions: Self-care/ADL training;DME and/or AE instruction;Patient/family education;Balance training;Cognitive remediation/compensation;Therapeutic activities    OT Goals(Current goals can be found in the care plan section) Acute Rehab OT Goals Patient Stated Goal: return home with her daughter OT Goal Formulation: With patient Time For Goal Achievement: 10/30/15 Potential to Achieve Goals: Good ADL Goals Pt Will Perform Grooming: with supervision;standing Pt Will Perform Upper Body Bathing: with supervision;sitting Pt Will Perform Upper Body Dressing: with supervision;sitting Pt Will Transfer to Toilet: with supervision;ambulating;bedside commode (over toilet) Pt Will Perform Toileting - Clothing Manipulation and hygiene: with supervision;sit to/from stand;with adaptive equipment  OT Frequency: Min 2X/week   Barriers to D/C:            Co-evaluation              End of Session    Activity Tolerance: Patient tolerated treatment well Patient left: in bed;with call bell/phone within reach;with family/visitor present   Time: [id number] OT Time Calculation (min): 24 min Charges:  OT General Charges $OT Visit: 1 Procedure OT Evaluation $OT Eval Moderate Complexity: 1 Procedure OT Treatments $Self Care/Home Management : 8-22 mins G-Codes:    Malka So 10/16/2015, 3:55 PM  [phone]

## 2015-10-16 NOTE — Progress Notes (Signed)
STROKE TEAM PROGRESS NOTE   SUBJECTIVE (INTERVAL HISTORY) Daughter is present. The patient is alert and able to follow some commands, but not fully orientated. Still has speech difficulties with naming and repetition as well as perseveration. TEE and loop scheduled for tomorrow.   OBJECTIVE Temp:  [98.2 F (36.8 C)-98.5 F (36.9 C)] 98.2 F (36.8 C) (10/08 2047) Pulse Rate:  [74-75] 75 (10/08 2047) Cardiac Rhythm: Normal sinus rhythm (10/08 1900) Resp:  [18] 18 (10/08 2047) BP: (161-176)/(70) 176/70 (10/08 2047) SpO2:  [94 %-96 %] 94 % (10/08 2047) Weight:  [203 lb 12.8 oz (92.4 kg)] 203 lb 12.8 oz (92.4 kg) (10/09 0405)  CBC:   Recent Labs Lab 10/12/15 1715 10/13/15 0102 10/14/15 0237  WBC 8.5 8.3 6.6  NEUTROABS 5.7  --  4.0  HGB 14.7 13.7 12.8  HCT 44.8 43.9 41.9  MCV 84.4 87.8 88.0  PLT 215 190 0000000    Basic Metabolic Panel:   Recent Labs Lab 10/13/15 0102 10/14/15 0237  NA 140 141  K 3.4* 4.3  CL 111 112*  CO2 20* 21*  GLUCOSE 184* 95  BUN 18 20  CREATININE 1.25* 1.20*  CALCIUM 9.2 9.0    Lipid Panel:     Component Value Date/Time   CHOL 169 10/14/2015 0237   TRIG 92 10/14/2015 0237   HDL 35 (L) 10/14/2015 0237   CHOLHDL 4.8 10/14/2015 0237   VLDL 18 10/14/2015 0237   LDLCALC 116 (H) 10/14/2015 0237   HgbA1c:  Lab Results  Component Value Date   HGBA1C 5.3 10/14/2015   Urine Drug Screen: No results found for: LABOPIA, COCAINSCRNUR, LABBENZ, AMPHETMU, THCU, LABBARB    IMAGING I have personally reviewed the radiological images below and agree with the radiology interpretations.  Dg Chest 2 View 10/12/2015 1. No acute abnormality.  2. Cardiomegaly with probable left ventricular hypertrophy.  3. Aortic atherosclerosis.   Ct Head Wo Contrast 10/12/2015 1. No acute abnormality.  2. Mild to moderate diffuse cerebral atrophy and mild cerebellar atrophy.  3. 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  4. Mild chronic small vessel white matter ischemic  changes in both cerebral hemispheres.   Mr Brain Wo Contrast 10/13/2015 Scattered areas of acute infarction in the left middle cerebral artery territory affecting the deep insula, posterior temporal lobe and temporoparietal junction, consistent with embolic infarctions in a posterior division left MCA vessel.  Old infarction in the left occipital lobe with encephalomalacia.  Old infarction at the right posterior frontal vertex with encephalomalacia.   MRA Head without Contrast 10/14/2015 No proximal stenosis or occlusion. M2 branch vessel occlusion on the left.  CUS - Bilateral: 1-39% ICA stenosis. Vertebral artery flow is antegrade.  TTE pending  TEE and loop pending   PHYSICAL EXAM HEENT-  Normocephalic, no lesions, without obvious abnormality.  Normal external eye and conjunctiva.  Normal TM's bilaterally.  Normal auditory canals and external ears. Normal external nose, mucus membranes and septum.  Normal pharynx. Cardiovascular- S1, S2 normal, pulses palpable throughout   Lungs- chest clear, no wheezing, rales, normal symmetric air entry Abdomen- normal findings: bowel sounds normal Extremities- no edema Lymph-no adenopathy palpable Musculoskeletal-no joint tenderness, deformity or swelling Skin-warm and dry, no hyperpigmentation, vitiligo, or suspicious lesions  Neurological Examination Mental Status: Alert, awake, orientated to place, people and month, but not to year. Mild expressive and receptive aphasia, naming 1/4 and only can repeat very simple sentences. Mild perseveration.    Cranial Nerves: II: Visual fields grossly normal, pupils equal, round,  reactive to light and accommodation III,IV, VI: ptosis not present, extra-ocular motions intact bilaterally V,VII: smile symmetric, facial light touch sensation normal bilaterally VIII: hearing normal bilaterally IX,X: uvula rises symmetrically XI: bilateral shoulder shrug XII: midline tongue extension Motor: Right :   Upper extremity   5/5                                      Left:     Upper extremity   5/5             Lower extremity   5/5                                                  Lower extremity   5/5 Tone and bulk:normal tone throughout; no atrophy noted Sensory: Pinprick and light touch intact throughout, bilaterally Deep Tendon Reflexes: 2+ and symmetric throughout Plantars: Right: downgoing                                Left: downgoing Cerebellar: normal finger-to-nose,  and normal heel-to-shin test Gait: Not tested   ASSESSMENT/PLAN [name] is a 82 y.o. female with history of previous strokes by MRI, renal calculi, UTIs, and colon cancer presenting with speech difficulties.  She did not receive IV t-PA due to late presentation.  Strokes:  left MCA territory scattered infarcts, embolic from unknown source.  Resultant mild receptive and expressive aphasia   MRI - Scattered areas of acute infarction in the left MCA territory. Remote infarcts at left posterior MCA and right frontal area.  MRA - M2 branch vessel occlusion on the left.  Carotid Doppler unremarkable   2D Echo - pending  TEE and loop pending  LDL - 116  HgbA1c pending  VTE prophylaxis - Lovenox Diet NPO time specified Except for: Sips with Meds Diet Heart Room service appropriate? Yes; Fluid consistency: Thin  No antithrombotic prior to admission, now on aspirin 325 mg daily.   Ongoing aggressive stroke risk factor management  Therapy recommendations: SNF   Disposition:  Pending  Hypertension  Stable Permissive hypertension (OK if < 220/120) but gradually normalize in 5-7 days Long-term BP goal normotensive  Hyperlipidemia  Home meds:  No lipid lowering medications prior to admission.  LDL 116, goal < 70  Now on Lipitor 40 mg daily  Continue statin at discharge   Other Stroke Risk Factors  Advanced age  Obesity, Body mass index is 34.98 kg/m., recommend weight loss, diet and  exercise as appropriate   Hx stroke by MRI  Other Active Problems  Kidney stone  Colon Ca s/p surgery   Hospital day # 3  Rosalin Hawking, MD PhD Stroke Neurology 10/16/2015 11:04 AM

## 2015-10-17 ENCOUNTER — Encounter (HOSPITAL_COMMUNITY)
Admission: EM | Disposition: A | Discharge status: Skilled Nursing Facility | Payer: Self-pay | Source: Home / Self Care | Attending: Family Medicine

## 2015-10-17 ENCOUNTER — Encounter (HOSPITAL_COMMUNITY): Payer: Self-pay | Admitting: *Deleted

## 2015-10-17 DIAGNOSIS — E7800 Pure hypercholesterolemia, unspecified: Secondary | ICD-10-CM

## 2015-10-17 DIAGNOSIS — I6340 Cerebral infarction due to embolism of unspecified cerebral artery: Secondary | ICD-10-CM

## 2015-10-17 DIAGNOSIS — I63419 Cerebral infarction due to embolism of unspecified middle cerebral artery: Secondary | ICD-10-CM

## 2015-10-17 DIAGNOSIS — I639 Cerebral infarction, unspecified: Secondary | ICD-10-CM

## 2015-10-17 SURGERY — LOOP RECORDER INSERTION

## 2015-10-17 SURGERY — CANCELLED PROCEDURE

## 2015-10-17 MED ORDER — LIDOCAINE-EPINEPHRINE 1 %-1:100000 IJ SOLN
INTRAMUSCULAR | Status: AC
  Filled 2015-10-17: qty 1

## 2015-10-17 MED ORDER — INFLUENZA VAC SPLIT QUAD 0.5 ML IM SUSY
0.5000 mL | PREFILLED_SYRINGE | INTRAMUSCULAR | Status: AC
  Administered 2015-10-18: 0.5 mL via INTRAMUSCULAR
  Filled 2015-10-17: qty 0.5

## 2015-10-17 NOTE — Progress Notes (Signed)
Spoke with Dr Acie Fredrickson and notified that patient has no IV access and has been attempted several times and STAT IV team consult has been ordered and they have been paged; stated that if patient cannot be done by 1600 she will have to be rescheduled; called and spoke with Wannetta Sender (scheduler) and updated her on the situation; stated that if she can't be done today then arrangements will be made to get her done as an outpatient due to scheduling conflicts for patient tomorrow.

## 2015-10-17 NOTE — Progress Notes (Signed)
Telemetry re-reviewed, no AFib or arrhythmias, TTE reviewed, no mention of LAA thrombus/shunts.  LVEF 65-70%, no significant VHD.  Unfortunately, unable to obtain IV for TEE. I have spoken with Dr Erlinda Hong.  She is frail and confused.  At this time, we agree that risks of TEE and loop recorder outweight the benefits.  I have therefore cancelled both TEE and loop recorder.  She can proceed with discharge as planned. Follow-up with Dr Leonie Man in the office.  Could consider 30 day event monitor at that time if her confusion is improved.  Electrophysiology team to see as needed while here. Please call with questions.   Thompson Grayer MD, [facility] 10/17/2015 3:55 PM

## 2015-10-17 NOTE — Clinical Social Work Note (Signed)
CSW met with patient. Son and daughter at bedside. Patient's children accepted bed offer at [facility] in [place]. They want a room on the first floor. Patient has a doctor's appointment at 1:00 pm tomorrow so her daughter will transport her there and then to the SNF after that. CSW contacted admissions coordinator at [facility] and made her aware of acceptance of bed offer. Patient will have to be in the building before 4:00 pm in order for them to be able to get her medications. Will notify patient's children.  Dayton Scrape, [place]

## 2015-10-17 NOTE — Progress Notes (Signed)
STROKE TEAM PROGRESS NOTE   SUBJECTIVE (INTERVAL HISTORY) Daughter is present. The patient is confused and not orientated, stated that I did not help her and not care about her. Daughter is in tears as pt said bad words to her too. Cardiology cancelled her TEE and loop as she is confused with advanced age, not a good candidate for loop. Daughter questioned about the decision and I spend fair amount of time to explain to her the reasoning behind it. She understood well.    OBJECTIVE Temp:  [97.3 F (36.3 C)-98.5 F (36.9 C)] 98.1 F (36.7 C) (10/10 1500) Pulse Rate:  [75-92] 75 (10/10 1500) Cardiac Rhythm: Normal sinus rhythm (10/10 0700) Resp:  [16-18] 18 (10/10 1500) BP: (140-194)/(67-87) 194/82 (10/10 1500) SpO2:  [94 %-100 %] 100 % (10/10 1500) Weight:  [200 lb 14.4 oz (91.1 kg)] 200 lb 14.4 oz (91.1 kg) (10/10 1500)  CBC:   Recent Labs Lab 10/12/15 1715 10/13/15 0102 10/14/15 0237  WBC 8.5 8.3 6.6  NEUTROABS 5.7  --  4.0  HGB 14.7 13.7 12.8  HCT 44.8 43.9 41.9  MCV 84.4 87.8 88.0  PLT 215 190 0000000    Basic Metabolic Panel:   Recent Labs Lab 10/13/15 0102 10/14/15 0237  NA 140 141  K 3.4* 4.3  CL 111 112*  CO2 20* 21*  GLUCOSE 184* 95  BUN 18 20  CREATININE 1.25* 1.20*  CALCIUM 9.2 9.0    Lipid Panel:     Component Value Date/Time   CHOL 169 10/14/2015 0237   TRIG 92 10/14/2015 0237   HDL 35 (L) 10/14/2015 0237   CHOLHDL 4.8 10/14/2015 0237   VLDL 18 10/14/2015 0237   LDLCALC 116 (H) 10/14/2015 0237   HgbA1c:  Lab Results  Component Value Date   HGBA1C 5.3 10/14/2015   Urine Drug Screen: No results found for: LABOPIA, COCAINSCRNUR, LABBENZ, AMPHETMU, THCU, LABBARB    IMAGING I have personally reviewed the radiological images below and agree with the radiology interpretations.  Dg Chest 2 View 10/12/2015 1. No acute abnormality.  2. Cardiomegaly with probable left ventricular hypertrophy.  3. Aortic atherosclerosis.   Ct Head Wo  Contrast 10/12/2015 1. No acute abnormality.  2. Mild to moderate diffuse cerebral atrophy and mild cerebellar atrophy.  3. 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  4. Mild chronic small vessel white matter ischemic changes in both cerebral hemispheres.   Mr Brain Wo Contrast 10/13/2015 Scattered areas of acute infarction in the left middle cerebral artery territory affecting the deep insula, posterior temporal lobe and temporoparietal junction, consistent with embolic infarctions in a posterior division left MCA vessel.  Old infarction in the left occipital lobe with encephalomalacia.  Old infarction at the right posterior frontal vertex with encephalomalacia.   MRA Head without Contrast 10/14/2015 No proximal stenosis or occlusion. M2 branch vessel occlusion on the left.  CUS - Bilateral: 1-39% ICA stenosis. Vertebral artery flow is antegrade.  TTE - Left ventricle: The cavity size was normal. There was moderate   concentric hypertrophy. Systolic function was vigorous. The   estimated ejection fraction was in the range of 65% to 70%. Wall   motion was normal; there were no regional wall motion   abnormalities. Doppler parameters are consistent with abnormal   left ventricular relaxation (grade 1 diastolic dysfunction).   Doppler parameters are consistent with elevated ventricular   end-diastolic filling pressure. - Aortic valve: Trileaflet; moderately thickened, moderately   calcified leaflets. There was very mild stenosis. There was  no   regurgitation. - Aortic root: The aortic root was normal in size. - Ascending aorta: The ascending aorta was normal in size. - Mitral valve: Calcified annulus. Mildly thickened leaflets .   Transvalvular velocity was within the normal range. There was no   evidence for stenosis. There was no regurgitation. - Left atrium: The atrium was mildly dilated. - Right ventricle: The cavity size was normal. Wall thickness was   normal. Systolic function was  normal. - Pulmonary arteries: Systolic pressure was within the normal   range. - Inferior vena cava: The vessel was normal in size. - Pericardium, extracardiac: There was no pericardial effusion.    PHYSICAL EXAM HEENT-  Normocephalic, no lesions, without obvious abnormality.  Normal external eye and conjunctiva.  Normal TM's bilaterally.  Normal auditory canals and external ears. Normal external nose, mucus membranes and septum.  Normal pharynx. Cardiovascular- S1, S2 normal, pulses palpable throughout   Lungs- chest clear, no wheezing, rales, normal symmetric air entry Abdomen- normal findings: bowel sounds normal Extremities- no edema Lymph-no adenopathy palpable Musculoskeletal-no joint tenderness, deformity or swelling Skin-warm and dry, no hyperpigmentation, vitiligo, or suspicious lesions  Neurological Examination Mental Status: Alert, awake, orientated to place, people, but not to time. Mild expressive and receptive aphasia, naming 1/4 and only can repeat very simple sentences. Mild perseveration with repetitive sentences Cranial Nerves: II: Visual fields grossly normal, pupils equal, round, reactive to light and accommodation III,IV, VI: ptosis not present, extra-ocular motions intact bilaterally V,VII: smile symmetric, facial light touch sensation normal bilaterally VIII: hearing normal bilaterally IX,X: uvula rises symmetrically XI: bilateral shoulder shrug XII: midline tongue extension Motor: Right :  Upper extremity   5/5                                      Left:     Upper extremity   5/5             Lower extremity   5/5                                                  Lower extremity   5/5 Tone and bulk:normal tone throughout; no atrophy noted Sensory: Pinprick and light touch intact throughout, bilaterally Deep Tendon Reflexes: 2+ and symmetric throughout Plantars: Right: downgoing                                Left: downgoing Cerebellar: normal  finger-to-nose,  and normal heel-to-shin test Gait: Not tested   ASSESSMENT/PLAN Ms. Rodnisha Lagace is a 82 y.o. female with history of previous strokes by MRI, renal calculi, UTIs, and colon cancer presenting with speech difficulties.  She did not receive IV t-PA due to late presentation.  Strokes:  left MCA territory scattered infarcts, embolic from unknown source.  Resultant mild receptive and expressive aphasia and confusion   MRI - Scattered areas of acute infarction in the left MCA territory. Remote infarcts at left posterior MCA and right frontal area.  MRA - M2 branch vessel occlusion on the left.  Carotid Doppler unremarkable   2D Echo - EF 65-70%  TEE and loop cancelled as Cardiology does not think pt is a good candidate. They recommend  re-evaluate in 6 weeks with neuro follow up.   LDL - 116  HgbA1c 5.3  VTE prophylaxis - Lovenox Diet NPO time specified Except for: Sips with Meds  No antithrombotic prior to admission, now on aspirin 325 mg daily. Continue ASA on discharge.  Ongoing aggressive stroke risk factor management  Therapy recommendations: SNF   Disposition:  Pending  Hypertension  Stable Permissive hypertension (OK if < 220/120) but gradually normalize in 5-7 days Long-term BP goal normotensive  Hyperlipidemia  Home meds:  No lipid lowering medications prior to admission.  LDL 116, goal < 70  Now on Lipitor 40 mg daily  Continue statin at discharge   Other Stroke Risk Factors  Advanced age  Obesity, Body mass index is 34.48 kg/m., recommend weight loss, diet and exercise as appropriate   Hx stroke by MRI  Other Active Problems  Kidney stone  Colon Ca s/p surgery  Confusion - likely vascular dementia   Hospital day # 4  Neurology will sign off. Please call with questions. Pt will follow up with Dr. Leonie Man at [facility] in about 6 weeks. Thanks for the consult.   Rosalin Hawking, MD PhD Stroke Neurology 10/17/2015 5:48 PM

## 2015-10-17 NOTE — Progress Notes (Signed)
PROGRESS NOTE [facility]   Darlene Flores   [id number] DOB: 11/29/1927  DOA: 10/12/2015 PCP: Red Christians, MD   Brief Narrative:  Darlene Flores a 82 y.o.femalewith history of colon cancer status post hemicolectomy being followed at [facility], history of recurrent UTI, kidney stones was brought to the ER after patient was found to have difficulty talking coherently. Patient's daughter stated that patient's symptoms started on the night of October 4th8 PM while driving in the car with the patient. At that time the spell lasted for 15 minutes and got resolved. Then symptoms appeared at her doctor office and she was brought to the ER In the ED CT showed no acute hemorrhagic event. MRI was done showing embolic strokes in the left MCA territory. Patient admitted for stroke magement    Subjective:  Patient seen and examined at with family bedside. Significantly improved, Plan for TEE today with implantable loop   Assessment & Plan:   1. Acute Left MCA ischemic stroke with receptive and expressive aphasia - Neuro recommendtaions appreciated on ASA and statin, SNF placement - social work consulted. Follow PT recs, for TEE today  2. Elevated troponin - Asymptomatic, unclear etiology a this time  3. Elevated blood pressure - since patient has stroke will allow for permissive hypertension (ok 220/120), should normalized soon, Continue to monitor,  Consider start Norvac if BP high 4. History of recurrent UTI - UA-shows features concerning for UTI but patient asymptomatic afebrile and denies any dysuria.Cultures growing Klebsiella/ Pan sensitive Per daughter her kidney doctor told her to no use abx for common asymptomatic UTI. Given patien with AMS and is hospitalized on Keflex   DVT prophylaxis: Lovenox Code Status: Full Family Communication: Family at bedside. Daughter and Son.  Disposition Plan: For TEE today. D/c tomorrow  to SNF  Consultants:   Neurology     Procedures:   ECHO, Carotid doppler, MRA/MRI   Antimicrobials:  Rocephin 1 dose 10/13/15   Objective: Vitals:   10/16/15 1811 10/16/15 1942 10/17/15 0409 10/17/15 1140  BP: 140/70 (!) 175/85 (!) 165/67 (!) 146/87  Pulse: 87 88 92 91  Resp: 16 18 16 18   Temp: 98.5 F (36.9 C) 97.3 F (36.3 C) 97.5 F (36.4 C) 98.1 F (36.7 C)  TempSrc: Oral Oral Oral Oral  SpO2:  97% 95% 94%  Weight:   91.1 kg (200 lb 14.4 oz)   Height:        Intake/Output Summary (Last 24 hours) at 10/17/15 1344 Last data filed at 10/17/15 1055  Gross per 24 hour  Intake              840 ml  Output              354 ml  Net              486 ml   Filed Weights   10/15/15 0646 10/16/15 0405 10/17/15 0409  Weight: 92.9 kg (204 lb 14.4 oz) 92.4 kg (203 lb 12.8 oz) 91.1 kg (200 lb 14.4 oz)    Examination:  General exam: Appears calm and comfortable  Respiratory system: Clear to auscultation. No wheezes,crackle or rhonchi Cardiovascular system: S1 & S2 heard, RRR. No JVD, murmurs, rubs or gallops Central nervous system: patient shows expressive and receptive aphasia improving , slight blunting of the nasolabial fold, sensation intact  Extremities: 1+ pitting edema. Symmetric, strength 5/5   Skin: No rashes, lesions or ulcers Psychiatry: Judgement and insight appear normal. Mood & affect  appropriate.    Data Reviewed: I have personally reviewed following labs and imaging studies  CBC:  Recent Labs Lab 10/12/15 1715 10/13/15 0102 10/14/15 0237  WBC 8.5 8.3 6.6  NEUTROABS 5.7  --  4.0  HGB 14.7 13.7 12.8  HCT 44.8 43.9 41.9  MCV 84.4 87.8 88.0  PLT 215 190 0000000   Basic Metabolic Panel:  Recent Labs Lab 10/12/15 1715 10/13/15 0102 10/14/15 0237  NA 138 140 141  K 3.9 3.4* 4.3  CL 109 111 112*  CO2 21* 20* 21*  GLUCOSE 121* 184* 95  BUN 22* 18 20  CREATININE 1.23* 1.25* 1.20*  CALCIUM 9.6 9.2 9.0   GFR: Estimated Creatinine Clearance: 36.8 mL/min (by C-G formula based on SCr  of 1.2 mg/dL (H)). Liver Function Tests:  Recent Labs Lab 10/12/15 1715 10/13/15 0102  AST 14* 14*  ALT 9* 7*  ALKPHOS 64 57  BILITOT 0.6 0.4  PROT 7.1 5.9*  ALBUMIN 4.1 3.3*   No results for input(s): LIPASE, AMYLASE in the last 168 hours. No results for input(s): AMMONIA in the last 168 hours. Coagulation Profile: No results for input(s): INR, PROTIME in the last 168 hours. Cardiac Enzymes:  Recent Labs Lab 10/12/15 1715 10/13/15 1024 10/13/15 1201 10/13/15 1931  TROPONINI 0.03* <0.03 <0.03 0.03*   BNP (last 3 results) No results for input(s): PROBNP in the last 8760 hours. HbA1C: No results for input(s): HGBA1C in the last 72 hours. CBG:  Recent Labs Lab 10/12/15 1705  GLUCAP 119*   Lipid Profile: No results for input(s): CHOL, HDL, LDLCALC, TRIG, CHOLHDL, LDLDIRECT in the last 72 hours. Thyroid Function Tests: No results for input(s): TSH, T4TOTAL, FREET4, T3FREE, THYROIDAB in the last 72 hours. Anemia Panel: No results for input(s): VITAMINB12, FOLATE, FERRITIN, TIBC, IRON, RETICCTPCT in the last 72 hours. Sepsis Labs: No results for input(s): PROCALCITON, LATICACIDVEN in the last 168 hours.  Recent Results (from the past 240 hour(s))  Urine culture     Status: Abnormal   Collection Time: 10/12/15  6:08 PM  Result Value Ref Range Status   Specimen Description URINE, CATHETERIZED  Final   Special Requests NONE  Final   Culture (A)  Final    >=100,000 COLONIES/mL KLEBSIELLA PNEUMONIAE >=100,000 COLONIES/mL AEROCOCCUS URINAE Standardized susceptibility testing for this organism is not available. Performed at [facility]    Report Status 10/15/2015 FINAL  Final   Organism ID, Bacteria KLEBSIELLA PNEUMONIAE (A)  Final      Susceptibility   Klebsiella pneumoniae - MIC*    AMPICILLIN >=32 RESISTANT Resistant     CEFAZOLIN <=4 SENSITIVE Sensitive     CEFTRIAXONE <=1 SENSITIVE Sensitive     CIPROFLOXACIN <=0.25 SENSITIVE Sensitive     GENTAMICIN  <=1 SENSITIVE Sensitive     IMIPENEM <=0.25 SENSITIVE Sensitive     NITROFURANTOIN 64 INTERMEDIATE Intermediate     TRIMETH/SULFA <=20 SENSITIVE Sensitive     AMPICILLIN/SULBACTAM 8 SENSITIVE Sensitive     PIP/TAZO <=4 SENSITIVE Sensitive     Extended ESBL NEGATIVE Sensitive     * >=100,000 COLONIES/mL KLEBSIELLA PNEUMONIAE     Radiology Studies: No results found.  Scheduled Meds: .  stroke: mapping our early stages of recovery book   Does not apply Once  . aspirin  300 mg Rectal Daily   Or  . aspirin  325 mg Oral Daily  . atorvastatin  40 mg Oral q1800  . cephALEXin  500 mg Oral Q12H  . enoxaparin (LOVENOX) injection  40 mg Subcutaneous Q24H  . [START ON 10/18/2015] Influenza vac split quadrivalent PF  0.5 mL Intramuscular Tomorrow-1000  . psyllium  1 packet Oral BID   Continuous Infusions:     LOS: 4 days    Chipper Oman, MD [facility] Pager [phone]  If 7PM-7AM, please contact night-coverage [email or web address] Password [email or web address] 10/17/2015, 1:44 PM

## 2015-10-18 DIAGNOSIS — N390 Urinary tract infection, site not specified: Secondary | ICD-10-CM

## 2015-10-18 MED ORDER — ATORVASTATIN CALCIUM 40 MG PO TABS: 40.0000 mg | ORAL_TABLET | Freq: Every day | ORAL | 1 refills | Status: AC

## 2015-10-18 MED ORDER — CEPHALEXIN 500 MG PO CAPS: 500.0000 mg | ORAL_CAPSULE | Freq: Two times a day (BID) | ORAL | 0 refills | Status: DC

## 2015-10-18 MED ORDER — ASPIRIN 325 MG PO TABS: 325.0000 mg | ORAL_TABLET | Freq: Every day | ORAL | 0 refills | Status: AC

## 2015-10-18 NOTE — Progress Notes (Signed)
Discharge plan and instructions discussed with patient's daughter. She verbalizes understanding. She was given 3 prescriptions along with discharge paper work to take to [facility]. Patient taken to the car by wheelchair.

## 2015-10-18 NOTE — Discharge Summary (Signed)
Physician Discharge Summary  Darlene Flores [id number] DOB: 1927/07/07 DOA: 10/12/2015  PCP: Red Christians, MD  Admit date: 10/12/2015 Discharge date: 10/18/2015  Admitted From: Home Disposition:  SNF  Recommendations for Outpatient Follow-up:  1. Follow up with PCP in 1-2 weeks 2. Please obtain BMP/CBC in one week   Home Health: N/A   Discharge Condition:Stable CODE STATUS:FULL Diet recommendation: Heart Healthy  Brief/Interim Summary:  Darlene Johnsonis a 82 y.o.femalewith history of colon cancer status post hemicolectomy being followed at [facility], history of recurrent UTI, kidney stones was brought to the ER after patient was found to have difficulty talking coherently. Patient's daughter stated that patient's symptoms started on the night of October 4th8 PM while driving in the car with the patient. At that time the spell lasted for 15 minutes and got resolved. Then symptoms appeared at her doctor office and she was brought to the ER In the ED CT showed no acute hemorrhagic event. MRI was done showing embolic strokes in the left MCA territory. Patient admitted for stroke magement     Discharge Diagnoses:  Acute left MCA stroke--embolic -Appreciate Neurology Consult -PT/OT evaluation-->SNF -Speech therapy eval--regular diet with thin liquids -CT brain--neg -MRI brain--Scattered areas of acute infarction in the left middle cerebral artery territory affecting the deep insula, posterior temporal lobe and temporoparietal junction, consistent with embolic infarctions in a posterior division left MCA vessel.  -MRA brain--No proximal stenosis or occlusion. M2 branch vessel occlusion on the left. -Carotid Duplex--negative for hemodynamically significant stenosis -Echo--EF 65% to percent, grade 1 DD -LDL--116 -HbA1C--5.3 -Antiplatelet--ASA 325 mg daily -TEE cancelled by cardiology as pt not a good candidate for loop recorder  Hypertension -initially allowed  permissive HTN -BP acceptable off anti-HTN meds -continue to monitor in outpt setting  Hyperlipidemia -LDL 116 -d/c with lipitor 40 mg daily  UTI -Urinalysis TNTC WBC -culture--kleb pneumoniae -d/c with cephalexin x 4 more days to complete 7 days of therapy  Discharge Instructions  Discharge Instructions    Ambulatory referral to Neurology    Complete by:  As directed    Follow up with Dr. Leonie Man in 6 weeks at [facility]. Thanks.       Medication List    TAKE these medications   aspirin 325 MG tablet Take 1 tablet (325 mg total) by mouth daily.   atorvastatin 40 MG tablet Commonly known as:  LIPITOR Take 1 tablet (40 mg total) by mouth daily at 6 PM.   cephALEXin 500 MG capsule Commonly known as:  KEFLEX Take 1 capsule (500 mg total) by mouth every 12 (twelve) hours.   psyllium 58.6 % packet Commonly known as:  METAMUCIL Take 1 packet by mouth at bedtime.       Contact information for follow-up providers    SELTZER, Desiree Hane, MD.   Specialty:  Oncology Contact information: [place] [id number] [place] [address] [phone]        SETHI,PRAMOD, MD. Schedule an appointment as soon as possible for a visit in 6 week(s).   Specialties:  Neurology, Radiology Contact information: [address] [place] [address] [phone]            Contact information for after-discharge care    Destination    [facility] .   Specialty:  [place] information: [address] [place] [address] [phone]                 Allergies  Allergen  Reactions  . Ivp Dye [Iodinated Diagnostic Agents] Other (See Comments)    Was told not take/use due to Shellfish allergy  . Shellfish Allergy Other (See Comments)    vomiting  . Iodine Other (See Comments)    Blisters on her skin    Consultations:  Neurology   Procedures/Studies: Dg Chest 2 View  Result Date:  10/12/2015 CLINICAL DATA:  Altered mental status with confusion for the past 6 hours. EXAM: CHEST  2 VIEW COMPARISON:  None. FINDINGS: Enlarged cardiac silhouette with a prominent left ventricular contour. Tortuous and calcified thoracic aorta. Clear lungs with mildly prominent interstitial markings. No pleural fluid. Thoracic spine degenerative changes. Bilateral shoulder degenerative changes with evidence of chronic bilateral rotator cuff tears. Diffuse osteopenia. IMPRESSION: 1. No acute abnormality. 2. Cardiomegaly with probable left ventricular hypertrophy. 3. Aortic atherosclerosis. Electronically Signed   By: Claudie Revering M.D.   On: 10/12/2015 18:07   Ct Head Wo Contrast  Result Date: 10/12/2015 CLINICAL DATA:  Altered mental status for the past 6 hours, with confusion. EXAM: CT HEAD WITHOUT CONTRAST TECHNIQUE: Contiguous axial images were obtained from the base of the skull through the vertex without intravenous contrast. COMPARISON:  None. FINDINGS: Brain: Diffusely enlarged ventricles and subarachnoid spaces. Patchy white matter low density in both cerebral hemispheres. Old right frontal lobe infarct additional old right frontal lobe lacunar infarct. Left occipital arachnoid cyst at the small communication with the occipital horn of the left lateral ventricle. No intracranial hemorrhage, mass lesion or CT evidence of acute infarction. Vascular: No hyperdense vessel or unexpected calcification. Skull: Normal. Negative for fracture or focal lesion. Sinuses/Orbits: Right mid ethmoid sinus mucosal thickening. Unremarkable orbits. Other: None. IMPRESSION: 1. No acute abnormality. 2. Mild to moderate diffuse cerebral atrophy and mild cerebellar atrophy. 3. 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 4. Mild chronic small vessel white matter ischemic changes in both cerebral hemispheres. Electronically Signed   By: Claudie Revering M.D.   On: 10/12/2015 18:06   Mr Darlene Flores Head Wo Contrast  Result Date: 10/14/2015 CLINICAL  DATA:  Difficulty speaking confusion. Acute infarctions demonstrated yesterday in the left middle cerebral artery territory. EXAM: MRA HEAD WITHOUT CONTRAST TECHNIQUE: Angiographic images of the Circle of Willis were obtained using MRA technique without intravenous contrast. COMPARISON:  MRI 10/13/2015.  CT 10/12/2015. FINDINGS: Both internal carotid arteries are widely patent through the skullbase. The anterior and middle cerebral vessels are patent proximally. Right sided anterior circulation vessels are normal. On the left, the anterior cerebral artery appears normal. There is M2 occlusion of posterior MCA branches. Both vertebral arteries are widely patent to the basilar. No basilar stenosis. Posterior circulation branch vessels are normal. IMPRESSION: No proximal stenosis or occlusion. M2 branch vessel occlusion on the left. Electronically Signed   By: Nelson Chimes M.D.   On: 10/14/2015 11:27   Mr Brain Wo Contrast  Result Date: 10/13/2015 CLINICAL DATA:  Difficulty speaking and confusion, acute onset. EXAM: MRI HEAD WITHOUT CONTRAST TECHNIQUE: Multiplanar, multiecho pulse sequences of the brain and surrounding structures were obtained without intravenous contrast. COMPARISON:  Head CT 10/12/2015 FINDINGS: Brain: Diffusion imaging shows scattered acute infarctions within the left MCA territory affecting the deep insula, posterior temporal and temporoparietal region. No large confluent infarction. No mass effect or hemorrhage. There is old infarction in the left occipital lobe which has progressed to encephalomalacia. There is old white matter ischemic change affecting the cerebral hemispheres most pronounced in the right posterior frontal deep white matter. There is an old cortical  infarction at the right posterior frontal vertex. No mass lesion. No hydrocephalus or extra-axial collection. Vascular: Major vessels at the base of the brain show flow. Skull and upper cervical spine: Negative Sinuses/Orbits:  Sinuses clear except for some fluid layering in the right maxillary sinus and a few scattered opacified ethmoid air cells. Previous cataract surgery. Other: None significant IMPRESSION: Scattered areas of acute infarction in the left middle cerebral artery territory affecting the deep insula, posterior temporal lobe and temporoparietal junction, consistent with embolic infarctions in a posterior division left MCA vessel. Old infarction in the left occipital lobe with encephalomalacia. Old infarction at the right posterior frontal vertex with encephalomalacia. Electronically Signed   By: Nelson Chimes M.D.   On: 10/13/2015 10:49        Discharge Exam: Vitals:   10/17/15 2009 10/18/15 0501  BP: 137/66 (!) 142/57  Pulse: 83 71  Resp: 18 16  Temp: 97.9 F (36.6 C) 97.5 F (36.4 C)   Vitals:   10/17/15 1140 10/17/15 1500 10/17/15 2009 10/18/15 0501  BP: (!) 146/87 (!) 194/82 137/66 (!) 142/57  Pulse: 91 75 83 71  Resp: 18 18 18 16   Temp: 98.1 F (36.7 C) 98.1 F (36.7 C) 97.9 F (36.6 C) 97.5 F (36.4 C)  TempSrc: Oral Oral Oral Oral  SpO2: 94% 100% 94% 98%  Weight:  91.1 kg (200 lb 14.4 oz)  89.9 kg (198 lb 3.2 oz)  Height:  5\' 4"  (1.626 m)      General: Pt is alert, awake, not in acute distress Cardiovascular: RRR, S1/S2 +, no rubs, no gallops Respiratory: CTA bilaterally, no wheezing, no rhonchi Abdominal: Soft, NT, ND, bowel sounds + Extremities: 1 + LE edema, no cyanosis   The results of significant diagnostics from this hospitalization (including imaging, microbiology, ancillary and laboratory) are listed below for reference.    Significant Diagnostic Studies: Dg Chest 2 View  Result Date: 10/12/2015 CLINICAL DATA:  Altered mental status with confusion for the past 6 hours. EXAM: CHEST  2 VIEW COMPARISON:  None. FINDINGS: Enlarged cardiac silhouette with a prominent left ventricular contour. Tortuous and calcified thoracic aorta. Clear lungs with mildly prominent  interstitial markings. No pleural fluid. Thoracic spine degenerative changes. Bilateral shoulder degenerative changes with evidence of chronic bilateral rotator cuff tears. Diffuse osteopenia. IMPRESSION: 1. No acute abnormality. 2. Cardiomegaly with probable left ventricular hypertrophy. 3. Aortic atherosclerosis. Electronically Signed   By: Claudie Revering M.D.   On: 10/12/2015 18:07   Ct Head Wo Contrast  Result Date: 10/12/2015 CLINICAL DATA:  Altered mental status for the past 6 hours, with confusion. EXAM: CT HEAD WITHOUT CONTRAST TECHNIQUE: Contiguous axial images were obtained from the base of the skull through the vertex without intravenous contrast. COMPARISON:  None. FINDINGS: Brain: Diffusely enlarged ventricles and subarachnoid spaces. Patchy white matter low density in both cerebral hemispheres. Old right frontal lobe infarct additional old right frontal lobe lacunar infarct. Left occipital arachnoid cyst at the small communication with the occipital horn of the left lateral ventricle. No intracranial hemorrhage, mass lesion or CT evidence of acute infarction. Vascular: No hyperdense vessel or unexpected calcification. Skull: Normal. Negative for fracture or focal lesion. Sinuses/Orbits: Right mid ethmoid sinus mucosal thickening. Unremarkable orbits. Other: None. IMPRESSION: 1. No acute abnormality. 2. Mild to moderate diffuse cerebral atrophy and mild cerebellar atrophy. 3. 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 4. Mild chronic small vessel white matter ischemic changes in both cerebral hemispheres. Electronically Signed   By: Claudie Revering  M.D.   On: 10/12/2015 18:06   Mr Darlene Flores Head Wo Contrast  Result Date: 10/14/2015 CLINICAL DATA:  Difficulty speaking confusion. Acute infarctions demonstrated yesterday in the left middle cerebral artery territory. EXAM: MRA HEAD WITHOUT CONTRAST TECHNIQUE: Angiographic images of the Circle of Willis were obtained using MRA technique without intravenous contrast.  COMPARISON:  MRI 10/13/2015.  CT 10/12/2015. FINDINGS: Both internal carotid arteries are widely patent through the skullbase. The anterior and middle cerebral vessels are patent proximally. Right sided anterior circulation vessels are normal. On the left, the anterior cerebral artery appears normal. There is M2 occlusion of posterior MCA branches. Both vertebral arteries are widely patent to the basilar. No basilar stenosis. Posterior circulation branch vessels are normal. IMPRESSION: No proximal stenosis or occlusion. M2 branch vessel occlusion on the left. Electronically Signed   By: Nelson Chimes M.D.   On: 10/14/2015 11:27   Mr Brain Wo Contrast  Result Date: 10/13/2015 CLINICAL DATA:  Difficulty speaking and confusion, acute onset. EXAM: MRI HEAD WITHOUT CONTRAST TECHNIQUE: Multiplanar, multiecho pulse sequences of the brain and surrounding structures were obtained without intravenous contrast. COMPARISON:  Head CT 10/12/2015 FINDINGS: Brain: Diffusion imaging shows scattered acute infarctions within the left MCA territory affecting the deep insula, posterior temporal and temporoparietal region. No large confluent infarction. No mass effect or hemorrhage. There is old infarction in the left occipital lobe which has progressed to encephalomalacia. There is old white matter ischemic change affecting the cerebral hemispheres most pronounced in the right posterior frontal deep white matter. There is an old cortical infarction at the right posterior frontal vertex. No mass lesion. No hydrocephalus or extra-axial collection. Vascular: Major vessels at the base of the brain show flow. Skull and upper cervical spine: Negative Sinuses/Orbits: Sinuses clear except for some fluid layering in the right maxillary sinus and a few scattered opacified ethmoid air cells. Previous cataract surgery. Other: None significant IMPRESSION: Scattered areas of acute infarction in the left middle cerebral artery territory affecting  the deep insula, posterior temporal lobe and temporoparietal junction, consistent with embolic infarctions in a posterior division left MCA vessel. Old infarction in the left occipital lobe with encephalomalacia. Old infarction at the right posterior frontal vertex with encephalomalacia. Electronically Signed   By: Nelson Chimes M.D.   On: 10/13/2015 10:49     Microbiology: Recent Results (from the past 240 hour(s))  Urine culture     Status: Abnormal   Collection Time: 10/12/15  6:08 PM  Result Value Ref Range Status   Specimen Description URINE, CATHETERIZED  Final   Special Requests NONE  Final   Culture (A)  Final    >=100,000 COLONIES/mL KLEBSIELLA PNEUMONIAE >=100,000 COLONIES/mL AEROCOCCUS URINAE Standardized susceptibility testing for this organism is not available. Performed at [facility]    Report Status 10/15/2015 FINAL  Final   Organism ID, Bacteria KLEBSIELLA PNEUMONIAE (A)  Final      Susceptibility   Klebsiella pneumoniae - MIC*    AMPICILLIN >=32 RESISTANT Resistant     CEFAZOLIN <=4 SENSITIVE Sensitive     CEFTRIAXONE <=1 SENSITIVE Sensitive     CIPROFLOXACIN <=0.25 SENSITIVE Sensitive     GENTAMICIN <=1 SENSITIVE Sensitive     IMIPENEM <=0.25 SENSITIVE Sensitive     NITROFURANTOIN 64 INTERMEDIATE Intermediate     TRIMETH/SULFA <=20 SENSITIVE Sensitive     AMPICILLIN/SULBACTAM 8 SENSITIVE Sensitive     PIP/TAZO <=4 SENSITIVE Sensitive     Extended ESBL NEGATIVE Sensitive     * >=100,000  COLONIES/mL KLEBSIELLA PNEUMONIAE     Labs: Basic Metabolic Panel:  Recent Labs Lab 10/12/15 1715 10/13/15 0102 10/14/15 0237  NA 138 140 141  K 3.9 3.4* 4.3  CL 109 111 112*  CO2 21* 20* 21*  GLUCOSE 121* 184* 95  BUN 22* 18 20  CREATININE 1.23* 1.25* 1.20*  CALCIUM 9.6 9.2 9.0   Liver Function Tests:  Recent Labs Lab 10/12/15 1715 10/13/15 0102  AST 14* 14*  ALT 9* 7*  ALKPHOS 64 57  BILITOT 0.6 0.4  PROT 7.1 5.9*  ALBUMIN 4.1 3.3*   No  results for input(s): LIPASE, AMYLASE in the last 168 hours. No results for input(s): AMMONIA in the last 168 hours. CBC:  Recent Labs Lab 10/12/15 1715 10/13/15 0102 10/14/15 0237  WBC 8.5 8.3 6.6  NEUTROABS 5.7  --  4.0  HGB 14.7 13.7 12.8  HCT 44.8 43.9 41.9  MCV 84.4 87.8 88.0  PLT 215 190 193   Cardiac Enzymes:  Recent Labs Lab 10/12/15 1715 10/13/15 1024 10/13/15 1201 10/13/15 1931  TROPONINI 0.03* <0.03 <0.03 0.03*   BNP: Invalid input(s): POCBNP CBG:  Recent Labs Lab 10/12/15 1705  GLUCAP 119*    Time coordinating discharge:  Greater than 30 minutes  Signed:  Lexington Krotz, DO [facility] Pager: [phone] 10/18/2015, 8:58 AM

## 2015-10-18 NOTE — Care Management Important Message (Signed)
Important Message  Patient Details  Name: Villa Gillan MRN: [id number] Date of Birth: [birth year]   Medicare Important Message Given:  Yes    Lacey Wallman Montine Circle 10/18/2015, 10:36 AM

## 2015-10-18 NOTE — Clinical Social Work Note (Signed)
Discharge summary sent to SNF. Patient can dc when ready. Her daughter will transport.  Dayton Scrape, [place]

## 2015-10-18 NOTE — Progress Notes (Signed)
Hand off report given to Sam, LPN at [facility].

## 2015-10-18 NOTE — Progress Notes (Signed)
Pt alert and confused times 3 improving since stroke, stable uneventful night, plan to discharge today to SNF for continued post-stroke rehab.

## 2015-10-18 NOTE — Clinical Social Work Note (Signed)
CSW facilitated patient discharge including contacting patient family and facility to confirm patient discharge plans. Clinical information faxed to facility and family agreeable with plan. Patient's daughter will transport by car to [facility]. RN to call report prior to discharge [phone]).  CSW will sign off for now as social work intervention is no longer needed. Please consult [place] again if new needs arise.  Dayton Scrape, [place]

## 2015-10-18 NOTE — Clinical Social Work Placement (Signed)
   CLINICAL SOCIAL WORK PLACEMENT  NOTE  Date:  10/18/2015  Patient Details  Name: Darlene Flores MRN: [id number] Date of Birth: 1927/08/24  Clinical Social Work is seeking post-discharge placement for this patient at the [place] level of care (*CSW will initial, date and re-position this form in  chart as items are completed):  Yes   Patient/family provided with [place] Work Department's list of facilities offering this level of care within the geographic area requested by the patient (or if unable, by the patient's family).  Yes   Patient/family informed of their freedom to choose among providers that offer the needed level of care, that participate in Medicare, Medicaid or managed care program needed by the patient, have an available bed and are willing to accept the patient.  Yes   Patient/family informed of [place]'s ownership interest in [facility] and [facility], as well as of the fact that they are under no obligation to receive care at these facilities.  PASRR submitted to EDS on 10/16/15     PASRR number received on       Existing PASRR number confirmed on 10/16/15     FL2 transmitted to all facilities in geographic area requested by pt/family on 10/16/15     FL2 transmitted to all facilities within larger geographic area on       Patient informed that his/her managed care company has contracts with or will negotiate with certain facilities, including the following:        Yes   Patient/family informed of bed offers received.  Patient chooses bed at [facility]     Physician recommends and patient chooses bed at      Patient to be transferred to [facility] on 10/18/15.  Patient to be transferred to facility by Car: Daughter     Patient family notified on 10/18/15 of transfer.  Name of family member notified:  Pam     PHYSICIAN Please prepare prescriptions     Additional Comment:     _______________________________________________ Candie Chroman, LCSW 10/18/2015, 11:25 AM

## 2015-10-31 ENCOUNTER — Telehealth: Payer: Self-pay | Admitting: Neurology

## 2015-10-31 NOTE — Telephone Encounter (Signed)
Rn spoke with patients daughter about her mom having some anxiety medication. Pt is currently at [facility] in [place] for rehab. Rn stated per Dr.Sethi the facility MD can prescribed her facility. Rn stated they can prescribed as long it makes her cooperative with her therapy. [name] verbalized understanding.

## 2015-10-31 NOTE — Telephone Encounter (Signed)
Pt's daughter called to advise the pt is now at [facility] in [facility] for rehab.  She said the skilled facility is wanting to give her something for anxiety and she wants to make sure it will be ok. She said pt had stroke on 10/5 and was advised by the hospitalitis that it was too soon to give her anything for anxiety. She said she trust Dr Leonie Man advise.

## 2015-10-31 NOTE — Telephone Encounter (Signed)
It is ok to give anxiety medication as long as it makes her more cooperative to participate with her therapies

## 2015-11-29 ENCOUNTER — Telehealth: Payer: Self-pay | Admitting: Neurology

## 2015-11-29 ENCOUNTER — Ambulatory Visit: Payer: Self-pay | Admitting: Neurology

## 2015-11-29 ENCOUNTER — Encounter: Payer: Self-pay | Admitting: Neurology

## 2015-11-29 ENCOUNTER — Ambulatory Visit (INDEPENDENT_AMBULATORY_CARE_PROVIDER_SITE_OTHER): Payer: Medicare Other | Admitting: Neurology

## 2015-11-29 VITALS — BP 171/94 | HR 94 | Ht 67.0 in | Wt 199.2 lb

## 2015-11-29 DIAGNOSIS — I639 Cerebral infarction, unspecified: Secondary | ICD-10-CM

## 2015-11-29 NOTE — Progress Notes (Signed)
[facility] [address] [place]. [place] [address] [phone]       OFFICE FOLLOW-UP NOTE  Ms. Adaria Hunsley Date of Birth:  [birth year] Medical Record Number:  [id number]   HPI:  29 year elderly Caucasian lady seen today for the first office follow-up visit for in-hospital admission for stroke in October 2017. She is accompanied by her daughter and son in law. Darlene Johnsonis a 82 y.o.femalewith history of colon cancer status post hemicolectomy being followed at [facility], history of recurrent UTI, kidney stones was brought to the ER after patient was found to have difficulty talking coherently. Patient's daughter stated that patient's symptoms started on the night of October 4th8 PM while driving in the car with the patient. At that time the spell lasted for 15 minutes and resolved. Yesterday when patient was taken to patient's routine appointment with general surgeon patient was unable to recall her birthday and was not able to bring out clear words. This persisted through the evening and patient was brought to the ER. CT head unremarkable. MRI brain was obtained showing scattered embolic strokes in the left MCA territory. Neurology was consult to see patient. Currently patient is alert and shows expressive and receptive aphasia..Date last known well: Date: 10/11/2015.Time last known well: Time: 16:00. tPA Given:No: Out of window. MRI scan of the brain showed scattered areas of acute infarction the left MCA territory affecting the insula, posterior temporal lobe and temporoparietal junction in the posterior division left MCA territory. There are old infarcts noted in the left occipital lobe as well as right posterior frontal cortex. MRA showed left M2 branch occlusion on the left. No proximal large vessel stenosis or occlusion. Carotid ultrasound showed no significant extracranial stenosis. Transthoracic echo showed normal ejection fraction. Patient had significant  aphasia on exam. There was discussion of possibly doing a tonsil is available echocardiogram and loop recorder but after discussion with cardiology was felt that unless the patient showed significant clinical improvement and became independent she would not be a good candidate for long-term anticoagulation and hence TEE and loop recorder were not placed. She was started on aspirin and discharge for rehabilitation and she in fact has done quite well. She is discharged from rehabilitation in a few days and family plans to take her home. She is able to communicate much better. She does still have some nonfluent speech and paraphasic errors particularly when she tries to talk quickly. Family seems quite happy with her progress. She is tolerating aspirin well without bruising or bleeding and Lipitor without muscle aches and pains. Her blood pressure is elevated in office today but she blames it on white coat hypertension as at home is much better  ROS:   14 system review of systems is positive for  memory loss, leg swelling, frequent waking, agitation, confusion, anxiety, nervousness and all other systems negative PMH:  Past Medical History:  Diagnosis Date  . Cancer ([place])   . Kidney stones   . Stroke ([place])   . UTI (urinary tract infection)     Social History:  Social History   Social History  . Marital status: Widowed    Spouse name: N/A  . Number of children: N/A  . Years of education: N/A   Occupational History  . Not on file.   Social History Main Topics  . Smoking status: Never Smoker  . Smokeless tobacco: Never Used  . Alcohol use No  . Drug use: No  . Sexual activity: Not on file  Other Topics Concern  . Not on file   Social History Narrative  . No narrative on file    Medications:   Current Outpatient Prescriptions on File Prior to Visit  Medication Sig Dispense Refill  . aspirin 325 MG tablet Take 1 tablet (325 mg total) by mouth daily. 30 tablet 0  . atorvastatin  (LIPITOR) 40 MG tablet Take 1 tablet (40 mg total) by mouth daily at 6 PM. 30 tablet 1  . psyllium (METAMUCIL) 58.6 % packet Take 1 packet by mouth at bedtime.      No current facility-administered medications on file prior to visit.     Allergies:   Allergies  Allergen Reactions  . Ivp Dye [Iodinated Diagnostic Agents] Other (See Comments)    Was told not take/use due to Shellfish allergy  . Shellfish Allergy Other (See Comments)    vomiting  . Iodine Other (See Comments)    Blisters on her skin    Physical Exam General: well developed, well nourished elderly Caucasian lady, seated, in no evident distress Head: head normocephalic and atraumatic.  Neck: supple with no carotid or supraclavicular bruits Cardiovascular: regular rate and rhythm, no murmurs Musculoskeletal: no deformity Skin:  no rash/petichiae Vascular:  Normal pulses all extremities Vitals:   11/29/15 0938  BP: (!) 171/94  Pulse: 94   Neurologic Exam Mental Status: Awake and fully alert. Oriented to place and time. Recent and remote memory intact. Attention span, concentration and fund of knowledge appropriate. Mood and affect appropriate. Mild expressive aphasia with nonfluent speech and few paraphasic errors. Good naming, comprehension and repetition Cranial Nerves: Fundoscopic exam reveals sharp disc margins. Pupils equal, briskly reactive to light. Extraocular movements full without nystagmus. Visual fields full to confrontation. Hearing intact. Facial sensation intact. Face, tongue, palate moves normally and symmetrically.  Motor: Normal bulk and tone. Normal strength in all tested extremity muscles. Sensory.: intact to touch ,pinprick .position and vibratory sensation.  Coordination: Rapid alternating movements normal in all extremities. Finger-to-nose and heel-to-shin performed accurately bilaterally. Gait and Station: Arises from chair without difficulty. Stance is normal. Gait demonstrates normal stride  length and balance . Able to heel, toe and tandem walk without difficulty.  Reflexes: 1+ and symmetric. Toes downgoing.   NIHSS  1 Modified Rankin 2   ASSESSMENT: 34 year Caucasian lady with embolic left MCA branch infarct in October 2017 of cryptogenic etiology. Vascular risk factor of only hyperlipidemia and borderline hypertension    PLAN: I had a long d/w patient about his recent stroke, risk for recurrent stroke/TIAs, personally independently reviewed imaging studies and stroke evaluation results and answered questions.Continue aspirin 325 mg daily  for secondary stroke prevention and maintain strict control of hypertension with blood pressure goal below 130/90, diabetes with hemoglobin A1c goal below 6.5% and lipids with LDL cholesterol goal below 70 mg/dL. I also advised the patient to eat a healthy diet with plenty of whole grains, cereals, fruits and vegetables, exercise regularly and maintain ideal body weight. Continue home physical and occupational therapy. Consider possible participation in the [place] trial if interested. Patient will be given information to review at home and decide. Followup in the future with my nurse practitioner in  6 months Greater than 50% of time during this 25 minute visit was spent on counseling,explanation of diagnosis, planning of further management, discussion with patient and family and coordination of care Antony Contras, MD  [facility] [address] [place], [place] [address]  Phone [phone] Fax [phone]  Note: This document was prepared with digital dictation and possible smart phrase technology. Any transcriptional errors that result from this process are unintentional

## 2015-11-29 NOTE — Patient Instructions (Addendum)
Stroke Prevention Some medical conditions and behaviors are associated with an increased chance of having a stroke. You may prevent a stroke by making healthy choices and managing medical conditions. How can I reduce my risk of having a stroke?  Stay physically active. Get at least 30 minutes of activity on most or all days.  Do not smoke. It may also be helpful to avoid exposure to secondhand smoke.  Limit alcohol use. Moderate alcohol use is considered to be:  No more than 2 drinks per day for men.  No more than 1 drink per day for nonpregnant women.  Eat healthy foods. This involves:  Eating 5 or more servings of fruits and vegetables a day.  Making dietary changes that address high blood pressure (hypertension), high cholesterol, diabetes, or obesity.  Manage your cholesterol levels.  Making food choices that are high in fiber and low in saturated fat, trans fat, and cholesterol may control cholesterol levels.  Take any prescribed medicines to control cholesterol as directed by your health care provider.  Manage your diabetes.  Controlling your carbohydrate and sugar intake is recommended to manage diabetes.  Take any prescribed medicines to control diabetes as directed by your health care provider.  Control your hypertension.  Making food choices that are low in salt (sodium), saturated fat, trans fat, and cholesterol is recommended to manage hypertension.  Ask your health care provider if you need treatment to lower your blood pressure. Take any prescribed medicines to control hypertension as directed by your health care provider.  If you are 18-39 years of age, have your blood pressure checked every 3-5 years. If you are 40 years of age or older, have your blood pressure checked every year.  Maintain a healthy weight.  Reducing calorie intake and making food choices that are low in sodium, saturated fat, trans fat, and cholesterol are recommended to manage  weight.  Stop drug abuse.  Avoid taking birth control pills.  Talk to your health care provider about the risks of taking birth control pills if you are over 35 years old, smoke, get migraines, or have ever had a blood clot.  Get evaluated for sleep disorders (sleep apnea).  Talk to your health care provider about getting a sleep evaluation if you snore a lot or have excessive sleepiness.  Take medicines only as directed by your health care provider.  For some people, aspirin or blood thinners (anticoagulants) are helpful in reducing the risk of forming abnormal blood clots that can lead to stroke. If you have the irregular heart rhythm of atrial fibrillation, you should be on a blood thinner unless there is a good reason you cannot take them.  Understand all your medicine instructions.  Make sure that other conditions (such as anemia or atherosclerosis) are addressed. Get help right away if:  You have sudden weakness or numbness of the face, arm, or leg, especially on one side of the body.  Your face or eyelid droops to one side.  You have sudden confusion.  You have trouble speaking (aphasia) or understanding.  You have sudden trouble seeing in one or both eyes.  You have sudden trouble walking.  You have dizziness.  You have a loss of balance or coordination.  You have a sudden, severe headache with no known cause.  You have new chest pain or an irregular heartbeat. Any of these symptoms may represent a serious problem that is an emergency. Do not wait to see if the symptoms will go away.   Get medical help at once. Call your local emergency services (911 in [place] Do not drive yourself to the hospital. This information is not intended to replace advice given to you by your health care provider. Make sure you discuss any questions you have with your health care provider. Document Released: 02/01/2004 Document Revised: 06/01/2015 Document Reviewed: 06/26/2012 Elsevier  Interactive Patient Education  2017 [employer] Inc.  

## 2015-11-29 NOTE — Telephone Encounter (Signed)
Pt's daughter called advised they just left the clinic and forgot to get the form to take back to the nursing home. She said it was stapled to the front of the packet, it was a face sheet with pt's info and close to the bottom the provider was to fill it out. The daughter said she is coming back to get it.

## 2015-11-29 NOTE — Telephone Encounter (Signed)
Nursing home sheet for MD to write summary left in envelope at front desk. The packet is for the MD and office. The form goes to medical records so it can be scan in.ALso the AVS that was given to the daughter goes to the nursing rehab facility.

## 2015-12-04 NOTE — Telephone Encounter (Signed)
error 

## 2015-12-08 DEATH — deceased

## 2016-05-28 ENCOUNTER — Ambulatory Visit: Payer: Medicare Other | Admitting: Nurse Practitioner

## 2018-05-02 IMAGING — MR MR MRA HEAD W/O CM
1 series · 20 of 48 positions shown · non-contrast
Comparison: MRI 10/13/2015.  CT 10/12/2015.

CLINICAL DATA: Difficulty speaking confusion. Acute infarctions
demonstrated yesterday in the left middle cerebral artery territory.

EXAM:
MRA HEAD WITHOUT CONTRAST
TECHNIQUE: Angiographic images of the Circle of Willis were obtained using MRA
technique without intravenous contrast.

[Series 3: ax (id) 2 · axial · 1.0mm · 0.43mm/px · z∈[-81,+6]mm · 20 of 184 slices shown]
[im 1/184]
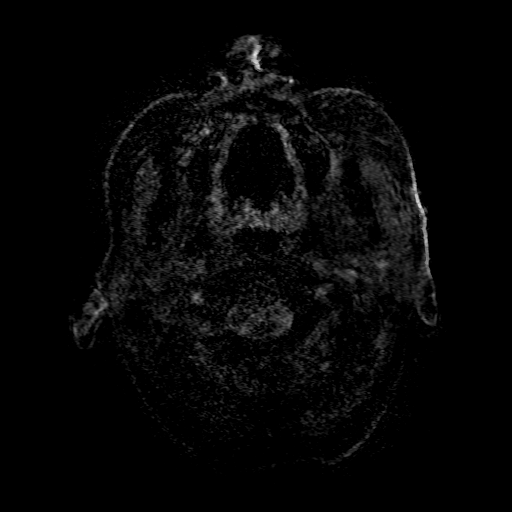
[im 4/184]
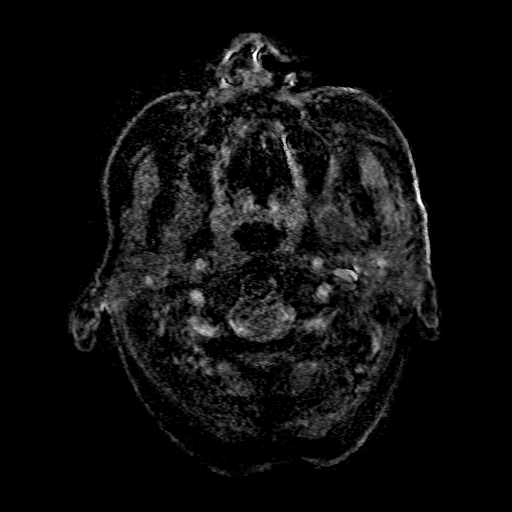
[im 8/184]
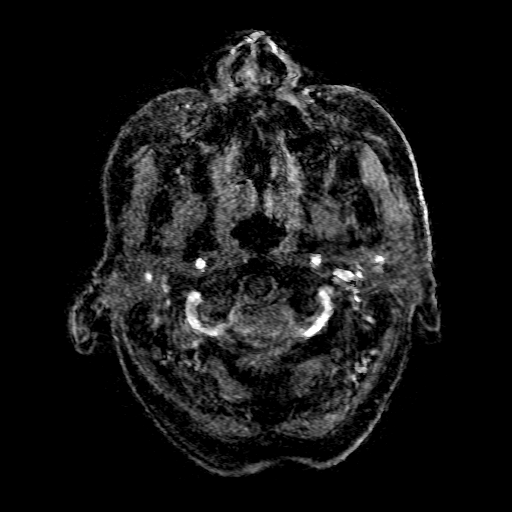
[im 12/184]
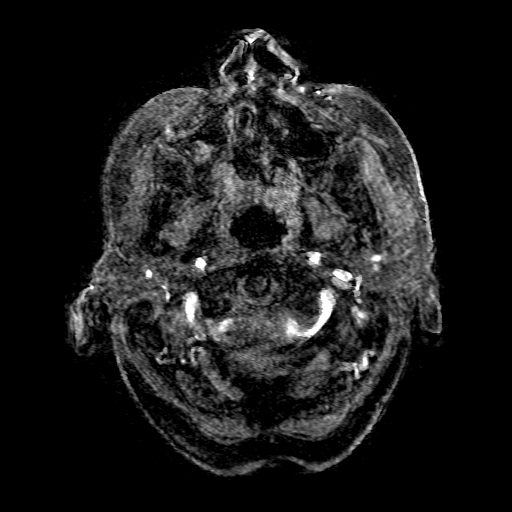
[im 16/184]
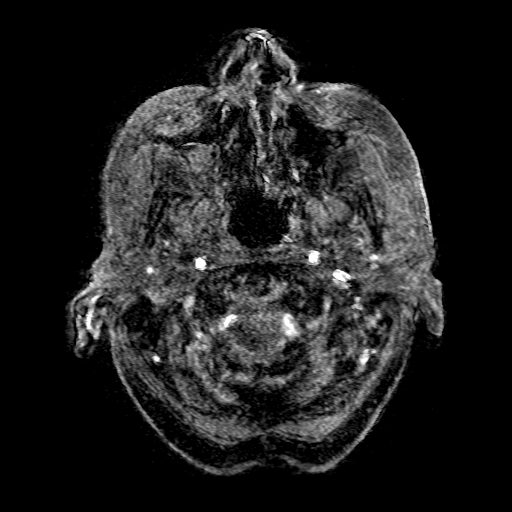
[im 20/184]
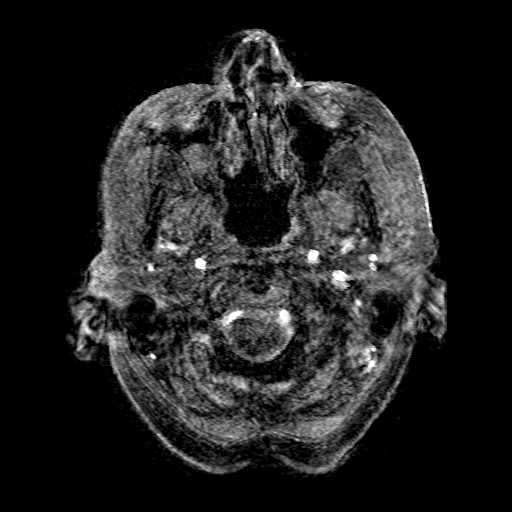
[im 24/184]
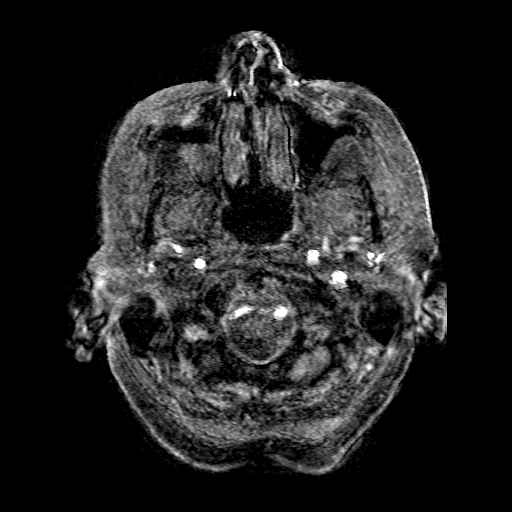
[im 28/184]
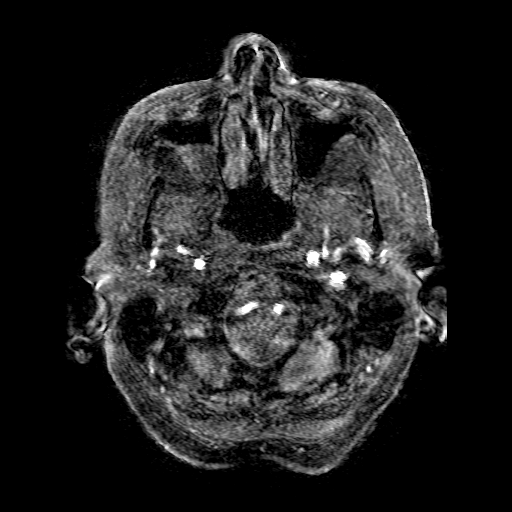
[im 32/184]
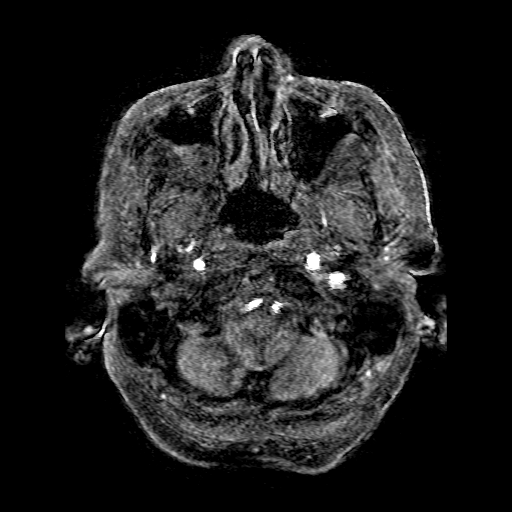
[im 36/184]
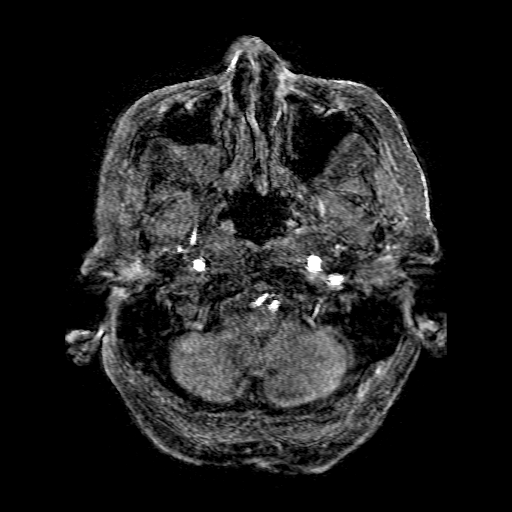
[im 39/184]
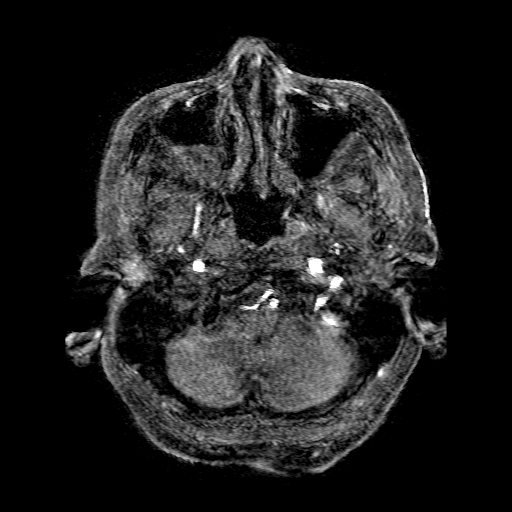
[im 43/184]
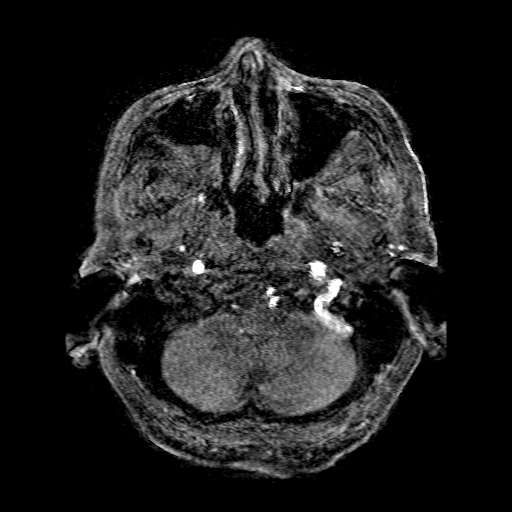
[im 59/184]
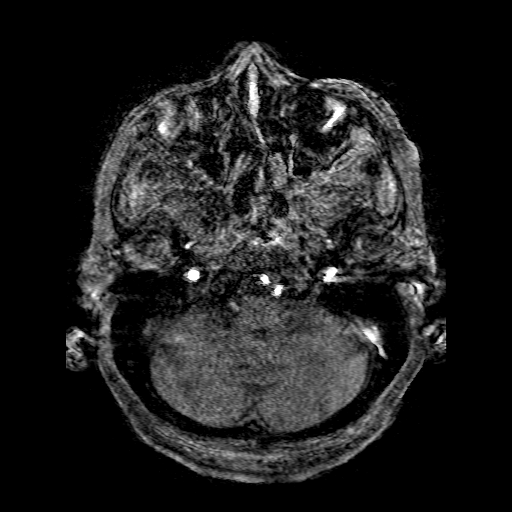
[im 82/184]
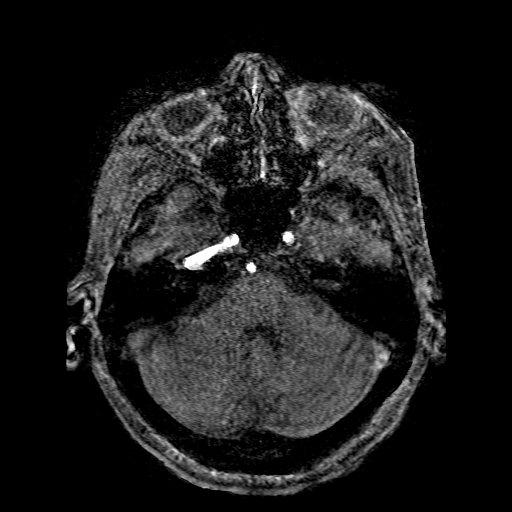
[im 94/184]
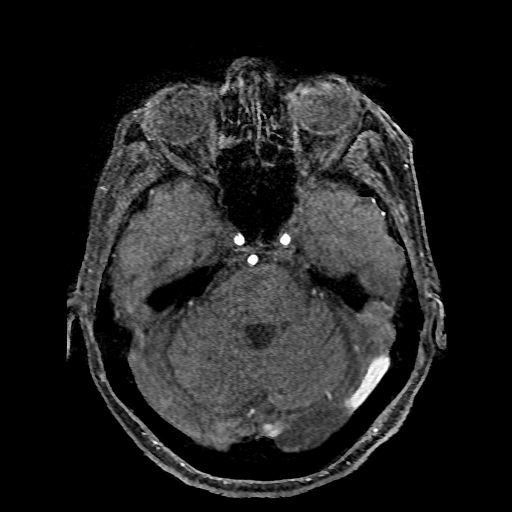
[im 106/184]
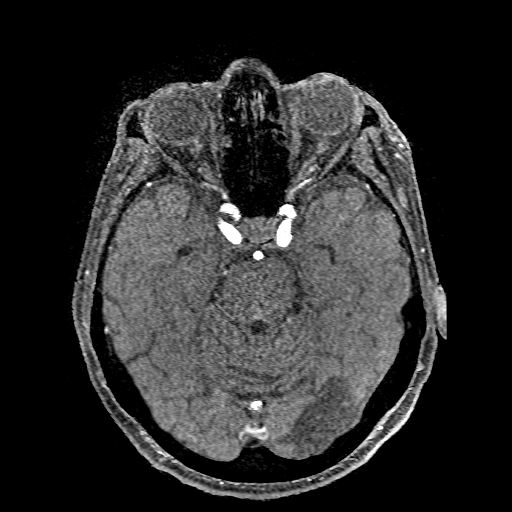
[im 129/184]
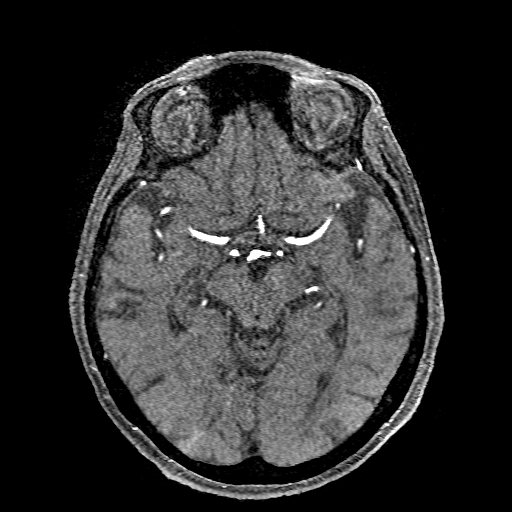
[im 152/184]
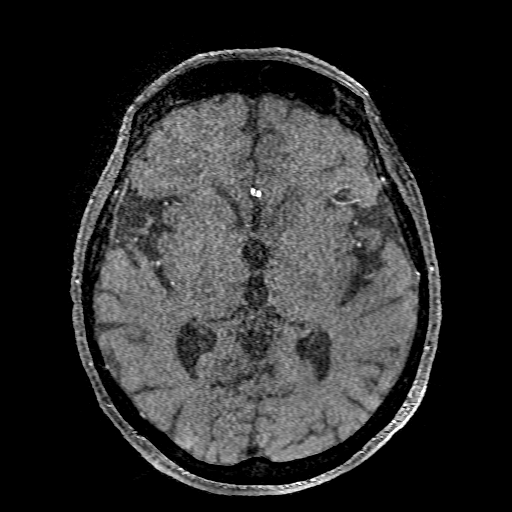
[im 156/184]
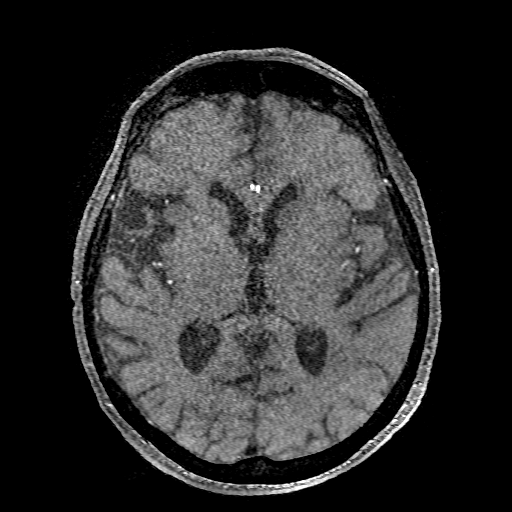
[im 176/184]
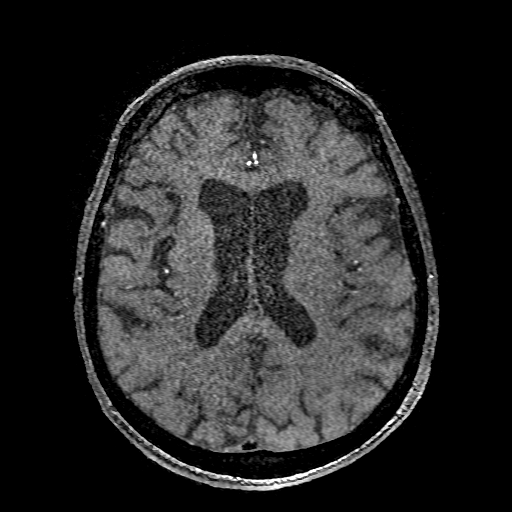

[20 of 48 positions shown; findings below may reference images not displayed]

FINDINGS: Both internal carotid arteries are widely patent through the
skullbase. The anterior and middle cerebral vessels are patent
proximally. Right sided anterior circulation vessels are normal. On
the left, the anterior cerebral artery appears normal. There is M2
occlusion of posterior MCA branches.

Both vertebral arteries are widely patent to the basilar. No basilar
stenosis. Posterior circulation branch vessels are normal.
IMPRESSION: No proximal stenosis or occlusion. M2 branch vessel occlusion on the
left.
